# Patient Record
Sex: Female | Born: 1990 | Race: White | Hispanic: No | Marital: Single | Smoking: Former smoker
Health system: Southern US, Community
[De-identification: ages and names within clinical notes are randomized; demographics above are authoritative.]

## PROBLEM LIST (undated history)

## (undated) DIAGNOSIS — R519 Headache, unspecified: Secondary | ICD-10-CM

## (undated) DIAGNOSIS — F419 Anxiety disorder, unspecified: Secondary | ICD-10-CM

## (undated) HISTORY — DX: Anxiety disorder, unspecified: F41.9

---

## 2021-07-13 ENCOUNTER — Ambulatory Visit (INDEPENDENT_AMBULATORY_CARE_PROVIDER_SITE_OTHER): Payer: 59 | Admitting: Obstetrics and Gynecology

## 2021-07-13 ENCOUNTER — Other Ambulatory Visit: Payer: Self-pay

## 2021-07-13 ENCOUNTER — Ambulatory Visit (INDEPENDENT_AMBULATORY_CARE_PROVIDER_SITE_OTHER): Payer: 59

## 2021-07-13 ENCOUNTER — Ambulatory Visit: Payer: Self-pay

## 2021-07-13 VITALS — Ht 64.0 in | Wt 137.9 lb

## 2021-07-13 DIAGNOSIS — O3680X0 Pregnancy with inconclusive fetal viability, not applicable or unspecified: Secondary | ICD-10-CM

## 2021-07-13 DIAGNOSIS — Z3481 Encounter for supervision of other normal pregnancy, first trimester: Secondary | ICD-10-CM

## 2021-07-13 DIAGNOSIS — O021 Missed abortion: Secondary | ICD-10-CM | POA: Diagnosis not present

## 2021-07-13 DIAGNOSIS — Z3491 Encounter for supervision of normal pregnancy, unspecified, first trimester: Secondary | ICD-10-CM | POA: Insufficient documentation

## 2021-07-13 LAB — POCT URINE PREGNANCY: Preg Test, Ur: POSITIVE — AB

## 2021-07-13 NOTE — Progress Notes (Signed)
New OB Intake  I connected with  Latamara Quinto on 07/13/21 at  3:00 PM EDT by in person. Video Visit and verified that I am speaking with the correct person using two identifiers. Nurse is located at [facility] and pt is located at [place].  I discussed the limitations, risks, security and privacy concerns of performing an evaluation and management service by telephone and the availability of in person appointments. I also discussed with the patient that there may be a patient responsible charge related to this service. The patient expressed understanding and agreed to proceed.  I explained I am completing New OB Intake today. Dating and viability scan revealed a non viable pregnancy at [redacted]w[redacted]d. Pt is G2/P1001. I reviewed her allergies, medications, Medical/Surgical/OB history, and appropriate screenings. I informed her of [facility] services. Based on history, this is a/an  pregnancy uncomplicated .   Patient Active Problem List   Diagnosis Date Noted   Encounter for supervision of normal pregnancy in first trimester 07/13/2021    Concerns addressed today  Delivery Plans:  Plans to deliver at [facility].   MyChart/Babyscripts MyChart access verified. I explained pt will have some visits in office and some virtually. Babyscripts instructions given and order placed. Patient verifies receipt of registration text/e-mail. Account successfully created and app downloaded.  Blood Pressure Cuff  Patient has private insurance; instructed to purchase blood pressure cuff and bring to first prenatal appt. Explained after first prenatal appt pt will check weekly and document in Babyscripts.  Weight scale: Patient    have weight scale. Weight scale ordered for patient to pick up form [facility].   Anatomy US Explained first scheduled [place] will be around 19 weeks. Dating and viability scan performed today.  Labs Discussed Avelina Laine genetic screening with patient. Would like both Panorama and Horizon drawn at new  OB visit. Routine prenatal labs needed.  Covid Vaccine Patient has not covid vaccine.   Mother/ Baby Dyad Candidate?    If yes, offer as possibility  Informed patient of [facility] website  and placed link in her AVS.   Social Determinants of Health Food Insecurity: Patient denies food insecurity. WIC Referral: Patient is interested in referral to [facility].  Transportation: Patient denies transportation needs. Childcare: Discussed no children allowed at ultrasound appointments. Offered childcare services; patient declines childcare services at this time.  Send link to Pregnancy Navigators   Placed OB Box on problem list and updated  First visit review I reviewed new OB appt with pt. Patient was seen by Dr. Jolayne Panther today for a non viable pregnancy at [redacted]w[redacted]d  Hibah Odonnell J Ravinder Lukehart, RN 07/13/2021  3:39 PM

## 2021-07-13 NOTE — Progress Notes (Signed)
29 yo P1 at [redacted]w[redacted]d by certain LMP presenting for pregnancy verification and new ob intake. Pelvic ultrasound performed both abdominally and transvaginally confirming an intrauterine pregnancy measuring 9 weeks without cardiac activity. This was consistent with a missed abortion Emotional support was provided.  Reviewed options of continue expectant management, medical management with cytotec or surgical intervention with D&E. Patient opted D&E  Patient will be scheduled for D&E.

## 2021-07-14 ENCOUNTER — Other Ambulatory Visit: Payer: Self-pay | Admitting: Obstetrics and Gynecology

## 2021-07-14 ENCOUNTER — Telehealth: Payer: Self-pay | Admitting: *Deleted

## 2021-07-14 ENCOUNTER — Other Ambulatory Visit: Payer: Self-pay

## 2021-07-14 ENCOUNTER — Encounter (HOSPITAL_BASED_OUTPATIENT_CLINIC_OR_DEPARTMENT_OTHER): Payer: Self-pay | Admitting: Obstetrics & Gynecology

## 2021-07-14 NOTE — Telephone Encounter (Signed)
Call to patient- advised surgery is scheduled for tomorrow 07-15-21 at 0900 at [facility]- arrive 0700. Surgery instructions reviewed and advised pre-op nurse will call.  Encounter closed.

## 2021-07-14 NOTE — H&P (Signed)
Preoperative History and Physical  Maria Meyer is a 30 y.o. G2P1001 here for surgical management of first trimester missed abortion measuring around nine weeks gestation; diagnosed during office visit on 07/13/21.   No significant preoperative concerns.  Proposed surgery: Dilation and Evacuation  Past Medical History:  Diagnosis Date   Anxiety    Headache    tension   Past Surgical History:  Procedure Laterality Date   CESAREAN SECTION  01/04/2013   OB History  Gravida Para Term Preterm AB Living  2 1 1     1   SAB IAB Ectopic Multiple Live Births          1    # Outcome Date GA Lbr Len/2nd Weight Sex Delivery Anes PTL Lv  2 Gravida           1 Term 01/04/13 [redacted]w[redacted]d  3799 g M CS-LTranv   LIV  Patient denies any other pertinent gynecologic issues.   No current facility-administered medications on file prior to encounter.   Current Outpatient Medications on File Prior to Encounter  Medication Sig Dispense Refill   Prenatal MV & Min w/FA-DHA (PRENATAL GUMMIES PO) Take by mouth.     Allergies  Allergen Reactions   Penicillins Hives    "Hives"    Social History:   reports that she quit smoking about 14 months ago. Her smoking use included cigarettes. She has never used smokeless tobacco. She reports that she does not currently use alcohol. She reports that she does not currently use drugs after having used the following drugs: Marijuana.  Family History  Problem Relation Age of Onset   Cancer Paternal Grandmother     Review of Systems: Pertinent items noted in HPI and remainder of comprehensive ROS otherwise negative.  PHYSICAL EXAM: Blood pressure 120/62, pulse 62, temperature 97.9 F (36.6 C), temperature source Oral, resp. rate 16, height 5\' 4"  (1.626 m), weight 64.7 kg, last menstrual period 04/14/2021, SpO2 100 %, unknown if currently breastfeeding. CONSTITUTIONAL: Well-developed, well-nourished female in no acute distress.  HENT:  Normocephalic, atraumatic,  External right and left ear normal. Oropharynx is clear and moist EYES: Conjunctivae and EOM are normal. Pupils are equal, round, and reactive to light. No scleral icterus.  NECK: Normal range of motion, supple, no masses SKIN: Skin is warm and dry. No rash noted. Not diaphoretic. No erythema. No pallor. NEUROLOGIC: Alert and oriented to person, place, and time. Normal reflexes, muscle tone coordination. No cranial nerve deficit noted. PSYCHIATRIC: Normal mood and affect. Normal behavior. Normal judgment and thought content. CARDIOVASCULAR: Normal heart rate noted, regular rhythm RESPIRATORY: Effort and breath sounds normal, no problems with respiration noted ABDOMEN: Soft, nontender, nondistended. PELVIC: Deferred MUSCULOSKELETAL: Normal range of motion. No edema and no tenderness. 2+ distal pulses.  Labs: Results for orders placed or performed during the hospital encounter of 07/15/21 (from the past 336 hour(s))  CBC   Collection Time: 07/15/21  8:00 AM  Result Value Ref Range   WBC 5.8 4.0 - 10.5 K/uL   RBC 4.33 3.87 - 5.11 MIL/uL   Hemoglobin 13.3 12.0 - 15.0 g/dL   HCT 07/17/21 07/17/21 - 61.4 %   MCV 91.9 80.0 - 100.0 fL   MCH 30.7 26.0 - 34.0 pg   MCHC 33.4 30.0 - 36.0 g/dL   RDW 43.1 54.0 - 08.6 %   Platelets 197 150 - 400 K/uL   nRBC 0.0 0.0 - 0.2 %  Type and screen [facility]  Collection Time: 07/15/21  8:00 AM  Result Value Ref Range   ABO/RH(D) PENDING    Antibody Screen PENDING    Sample Expiration      07/18/2021,2359 Performed at [facility], [address], [place], [place] [address]   Results for orders placed or performed in visit on 07/13/21 (from the past 336 hour(s))  POCT urine pregnancy   Collection Time: 07/13/21  4:56 PM  Result Value Ref Range   Preg Test, Ur Positive (A) Negative   Imaging: US OB Limited  Result Date: 07/15/2021 ----------------------------------------------------------------------  OBSTETRICS REPORT                        (Signed Final 07/15/2021 08:01 am) ---------------------------------------------------------------------- Patient Info  ID #:       [id number]                          D.O.B.:  [birth year] (29 yrs)  Name:       Maria Meyer                  Visit Date: 07/13/2021 03:45 am ---------------------------------------------------------------------- Performed By  Attending:        Catalina Antigua MD      Ref. Address:     Faculty  Performed By:     Angelica Pou RN         Location:         Center for                                                             [facility]  Referred By:      Catalina Antigua MD ---------------------------------------------------------------------- Orders  #  Description                           Code        Ordered By  1  US OB LIMITED                         76195.0     PEGGY CONSTANT ----------------------------------------------------------------------  #  Order #                     Accession #                Episode #  1  [id number]                   [id number]                 [id number] ---------------------------------------------------------------------- Indications  [redacted] weeks gestation of pregnancy                 Z3A.08 ---------------------------------------------------------------------- Fetal Evaluation  Num Of Fetuses:         1  Preg. Location:         Intrauterine  Cardiac Activity:       Not visualized ---------------------------------------------------------------------- Biometry  CRL:      23.2  mm     G. Age:  8w 6d                   EDD:   02/16/22 ---------------------------------------------------------------------- Gestational Age  Best:          8w 6d      Det. By:  U/S C R L (07/13/21)     EDD:   02/16/22 ---------------------------------------------------------------------- Comments  Non living IUP  at [redacted]w[redacted]d by CRL. ---------------------------------------------------------------------- Impression  Missed abortion around nine weeks gestation ---------------------------------------------------------------------- Recommendations  Patient was counseled by Dr. Jolayne Panther about options for  management, she will be scheduled for surgical management. ----------------------------------------------------------------------               Jaynie Collins, MD Electronically Signed Final Report   07/15/2021 08:01 am ----------------------------------------------------------------------   Assessment: Patient Active Problem List   Diagnosis Date Noted   Missed abortion around nine weeks gestation 07/13/2021    Plan: Patient will undergo surgical management with Dilation and Evacuation.  Risks of surgery including bleeding, infection, injury to surrounding organs, need for additional procedures, possibility of intrauterine scarring which may impair future fertility, risk of retained products which may require further management and other postoperative/anesthesia complications were explained to patient.  Patient was offered Kizzie Fantasia genetic testing for the products of conception, she declined this option.  Likelihood of success of complete evacuation of the uterus was discussed with the patient.  Written informed consent was obtained.  Patient has been NPO since last night  she will remain NPO for procedure. Anesthesia and OR aware.  Preoperative prophylactic Doxycycline 200mg  IV  has been ordered and is on call to the OR.  To OR when ready.     , MD, FACOG Obstetrician & Gynecologist, [facility] for [facility]

## 2021-07-14 NOTE — Progress Notes (Addendum)
Spoke w/ via phone for pre-op interview---pt Lab needs dos----  none per anesthesia, surgery orders pending             Lab results------none COVID test -----patient states asymptomatic no test needed Arrive at -------715 am 07-15-2021 NPO after MN NO Solid Food.  Clear liquids from MN until---615 am Med rec completed Medications to take morning of surgery -----none Diabetic medication -----n/a Patient instructed no nail polish to be worn day of surgery Patient instructed to bring photo id and insurance card day of surgery Patient aware to have Driver (ride ) / caregiver partner jimmy hill    for 24 hours after surgery  Patient Special Instructions -----none Pre-Op special Istructions -----none Patient verbalized understanding of instructions that were given at this phone interview. Patient denies shortness of breath, chest pain, fever, cough at this phone interview.

## 2021-07-15 ENCOUNTER — Ambulatory Visit (HOSPITAL_BASED_OUTPATIENT_CLINIC_OR_DEPARTMENT_OTHER): Payer: 59 | Admitting: Anesthesiology

## 2021-07-15 ENCOUNTER — Encounter (HOSPITAL_BASED_OUTPATIENT_CLINIC_OR_DEPARTMENT_OTHER): Payer: Self-pay | Admitting: Obstetrics & Gynecology

## 2021-07-15 ENCOUNTER — Encounter (HOSPITAL_BASED_OUTPATIENT_CLINIC_OR_DEPARTMENT_OTHER)
Admission: RE | Disposition: A | Discharge status: Home / Self Care | Payer: Self-pay | Source: Home / Self Care | Attending: Obstetrics & Gynecology

## 2021-07-15 ENCOUNTER — Ambulatory Visit (HOSPITAL_BASED_OUTPATIENT_CLINIC_OR_DEPARTMENT_OTHER)
Admission: RE | Admit: 2021-07-15 | Discharge: 2021-07-15 | Disposition: A | Discharge status: Home / Self Care | Payer: 59 | Attending: Obstetrics & Gynecology | Admitting: Obstetrics & Gynecology

## 2021-07-15 DIAGNOSIS — Z87891 Personal history of nicotine dependence: Secondary | ICD-10-CM | POA: Diagnosis not present

## 2021-07-15 DIAGNOSIS — Z880 Allergy status to penicillin: Secondary | ICD-10-CM | POA: Insufficient documentation

## 2021-07-15 DIAGNOSIS — O021 Missed abortion: Secondary | ICD-10-CM | POA: Diagnosis not present

## 2021-07-15 HISTORY — PX: DILATION AND EVACUATION: SHX1459

## 2021-07-15 HISTORY — DX: Headache, unspecified: R51.9

## 2021-07-15 LAB — TYPE AND SCREEN
ABO/RH(D): O POS
Antibody Screen: NEGATIVE

## 2021-07-15 LAB — CBC
HCT: 39.8 % (ref 36.0–46.0)
Hemoglobin: 13.3 g/dL (ref 12.0–15.0)
MCH: 30.7 pg (ref 26.0–34.0)
MCHC: 33.4 g/dL (ref 30.0–36.0)
MCV: 91.9 fL (ref 80.0–100.0)
Platelets: 197 K/uL (ref 150–400)
RBC: 4.33 MIL/uL (ref 3.87–5.11)
RDW: 11.9 % (ref 11.5–15.5)
WBC: 5.8 K/uL (ref 4.0–10.5)
nRBC: 0 % (ref 0.0–0.2)

## 2021-07-15 LAB — ABO/RH: ABO/RH(D): O POS

## 2021-07-15 SURGERY — DILATION AND EVACUATION, UTERUS
Anesthesia: Monitor Anesthesia Care | Site: Vagina

## 2021-07-15 MED ORDER — PROPOFOL 10 MG/ML IV BOLUS
INTRAVENOUS | Status: AC
  Filled 2021-07-15: qty 20

## 2021-07-15 MED ORDER — PROPOFOL 500 MG/50ML IV EMUL
INTRAVENOUS | Status: DC | PRN
  Administered 2021-07-15: 125 ug/kg/min via INTRAVENOUS

## 2021-07-15 MED ORDER — BUPIVACAINE HCL 0.5 % IJ SOLN
INTRAMUSCULAR | Status: DC | PRN
  Administered 2021-07-15: 30 mL

## 2021-07-15 MED ORDER — DOXYCYCLINE HYCLATE 100 MG IV SOLR
200.0000 mg | INTRAVENOUS | Status: AC
  Administered 2021-07-15: 200 mg via INTRAVENOUS
  Filled 2021-07-15: qty 200

## 2021-07-15 MED ORDER — MIDAZOLAM HCL 2 MG/2ML IJ SOLN
INTRAMUSCULAR | Status: AC
  Filled 2021-07-15: qty 2

## 2021-07-15 MED ORDER — FENTANYL CITRATE (PF) 100 MCG/2ML IJ SOLN
INTRAMUSCULAR | Status: DC | PRN
  Administered 2021-07-15 (×2): 50 ug via INTRAVENOUS

## 2021-07-15 MED ORDER — ONDANSETRON HCL 4 MG/2ML IJ SOLN
INTRAMUSCULAR | Status: DC | PRN
  Administered 2021-07-15: 4 mg via INTRAVENOUS

## 2021-07-15 MED ORDER — ACETAMINOPHEN 500 MG PO TABS
1000.0000 mg | ORAL_TABLET | Freq: Once | ORAL | Status: AC
  Administered 2021-07-15: 1000 mg via ORAL

## 2021-07-15 MED ORDER — OXYCODONE HCL 5 MG PO TABS: 5.0000 mg | ORAL_TABLET | Freq: Once | ORAL | 0 refills | Status: DC | PRN

## 2021-07-15 MED ORDER — KETOROLAC TROMETHAMINE 30 MG/ML IJ SOLN
INTRAMUSCULAR | Status: DC | PRN
  Administered 2021-07-15: 30 mg via INTRAVENOUS

## 2021-07-15 MED ORDER — OXYCODONE HCL 5 MG/5ML PO SOLN
5.0000 mg | Freq: Once | ORAL | Status: AC | PRN
Start: 2021-07-15 — End: 2021-07-15

## 2021-07-15 MED ORDER — FENTANYL CITRATE (PF) 100 MCG/2ML IJ SOLN
INTRAMUSCULAR | Status: AC
  Filled 2021-07-15: qty 2

## 2021-07-15 MED ORDER — OXYCODONE HCL 5 MG PO TABS
5.0000 mg | ORAL_TABLET | Freq: Once | ORAL | Status: AC | PRN
  Administered 2021-07-15: 5 mg via ORAL

## 2021-07-15 MED ORDER — PROMETHAZINE HCL 25 MG/ML IJ SOLN: 6.2500 mg | INTRAMUSCULAR | Status: DC | PRN

## 2021-07-15 MED ORDER — LIDOCAINE HCL (CARDIAC) PF 100 MG/5ML IV SOSY
PREFILLED_SYRINGE | INTRAVENOUS | Status: DC | PRN
Start: 2021-07-15 — End: 2021-07-15
  Administered 2021-07-15: 60 mg via INTRAVENOUS

## 2021-07-15 MED ORDER — ACETAMINOPHEN 500 MG PO TABS
ORAL_TABLET | ORAL | Status: AC
  Filled 2021-07-15: qty 2

## 2021-07-15 MED ORDER — ONDANSETRON HCL 4 MG/2ML IJ SOLN
INTRAMUSCULAR | Status: AC
  Filled 2021-07-15: qty 2

## 2021-07-15 MED ORDER — IBUPROFEN 600 MG PO TABS
600.0000 mg | ORAL_TABLET | Freq: Four times a day (QID) | ORAL | 2 refills | Status: DC | PRN
Start: 2021-07-15 — End: 2021-10-13

## 2021-07-15 MED ORDER — LACTATED RINGERS IV SOLN
INTRAVENOUS | Status: DC
  Administered 2021-07-15: 1000 mL via INTRAVENOUS

## 2021-07-15 MED ORDER — POVIDONE-IODINE 10 % EX SWAB: 2.0000 "application " | Freq: Once | CUTANEOUS | Status: DC

## 2021-07-15 MED ORDER — ALPRAZOLAM 0.5 MG PO TABS: 0.5000 mg | ORAL_TABLET | Freq: Three times a day (TID) | ORAL | 0 refills | Status: DC | PRN

## 2021-07-15 MED ORDER — DROPERIDOL 2.5 MG/ML IJ SOLN: 0.6250 mg | Freq: Once | INTRAMUSCULAR | Status: DC | PRN

## 2021-07-15 MED ORDER — OXYCODONE HCL 5 MG PO TABS
ORAL_TABLET | ORAL | Status: AC
  Filled 2021-07-15: qty 1

## 2021-07-15 MED ORDER — MIDAZOLAM HCL 2 MG/2ML IJ SOLN
INTRAMUSCULAR | Status: DC | PRN
  Administered 2021-07-15: 2 mg via INTRAVENOUS
  Administered 2021-07-15: .5 mg via INTRAVENOUS
  Administered 2021-07-15: 1 mg via INTRAVENOUS
  Administered 2021-07-15: .5 mg via INTRAVENOUS

## 2021-07-15 MED ORDER — DOCUSATE SODIUM 100 MG PO CAPS: 100.0000 mg | ORAL_CAPSULE | Freq: Two times a day (BID) | ORAL | 2 refills | Status: DC | PRN

## 2021-07-15 MED ORDER — DEXAMETHASONE SODIUM PHOSPHATE 10 MG/ML IJ SOLN
INTRAMUSCULAR | Status: DC | PRN
  Administered 2021-07-15: 10 mg via INTRAVENOUS

## 2021-07-15 MED ORDER — LACTATED RINGERS IV SOLN: INTRAVENOUS | Status: DC

## 2021-07-15 MED ORDER — FENTANYL CITRATE (PF) 100 MCG/2ML IJ SOLN
25.0000 ug | INTRAMUSCULAR | Status: DC | PRN
  Administered 2021-07-15 (×2): 50 ug via INTRAVENOUS

## 2021-07-15 MED ORDER — 0.9 % SODIUM CHLORIDE (POUR BTL) OPTIME
TOPICAL | Status: DC | PRN
  Administered 2021-07-15: 500 mL

## 2021-07-15 MED ORDER — PROPOFOL 10 MG/ML IV BOLUS
INTRAVENOUS | Status: DC | PRN
  Administered 2021-07-15: 20 mg via INTRAVENOUS
  Administered 2021-07-15: 40 mg via INTRAVENOUS

## 2021-07-15 MED ORDER — DEXAMETHASONE SODIUM PHOSPHATE 10 MG/ML IJ SOLN
INTRAMUSCULAR | Status: AC
  Filled 2021-07-15: qty 1

## 2021-07-15 MED ORDER — KETOROLAC TROMETHAMINE 30 MG/ML IJ SOLN
INTRAMUSCULAR | Status: AC
  Filled 2021-07-15: qty 1

## 2021-07-15 SURGICAL SUPPLY — 26 items
CATH ROBINSON RED A/P 14FR (CATHETERS) ×2 IMPLANT
DECANTER SPIKE VIAL GLASS SM (MISCELLANEOUS) ×2 IMPLANT
FILTER UTR ASPR ASSEMBLY (MISCELLANEOUS) IMPLANT
GAUZE 4X4 16PLY ~~LOC~~+RFID DBL (SPONGE) ×2 IMPLANT
GLOVE SURG ENC MOIS LTX SZ6 (GLOVE) ×2 IMPLANT
GLOVE SURG LTX SZ7 (GLOVE) ×2 IMPLANT
GLOVE SURG UNDER POLY LF SZ6 (GLOVE) ×2 IMPLANT
GLOVE SURG UNDER POLY LF SZ7 (GLOVE) ×2 IMPLANT
GLOVE SURG UNDER POLY LF SZ7.5 (GLOVE) ×2 IMPLANT
GOWN STRL REUS W/TWL LRG LVL3 (GOWN DISPOSABLE) ×4 IMPLANT
HOSE CONNECTING 18IN BERKELEY (TUBING) ×2 IMPLANT
KIT BERKELEY 1ST TRI 3/8 NO TR (MISCELLANEOUS) IMPLANT
KIT BERKELEY 1ST TRIMESTER 3/8 (MISCELLANEOUS) ×4 IMPLANT
KIT TURNOVER CYSTO (KITS) ×2 IMPLANT
NS IRRIG 1000ML POUR BTL (IV SOLUTION) ×2 IMPLANT
PACK VAGINAL MINOR WOMEN LF (CUSTOM PROCEDURE TRAY) ×2 IMPLANT
PAD OB MATERNITY 4.3X12.25 (PERSONAL CARE ITEMS) ×2 IMPLANT
PAD PREP 24X48 CUFFED NSTRL (MISCELLANEOUS) ×2 IMPLANT
SET BERKELEY SUCTION TUBING (SUCTIONS) ×2 IMPLANT
SLEEVE SCD COMPRESS KNEE MED (STOCKING) ×2 IMPLANT
TOWEL OR 17X26 10 PK STRL BLUE (TOWEL DISPOSABLE) ×2 IMPLANT
VACURETTE 10 RIGID CVD (CANNULA) IMPLANT
VACURETTE 6 ASPIR F TIP BERK (CANNULA) IMPLANT
VACURETTE 7MM CVD STRL WRAP (CANNULA) IMPLANT
VACURETTE 8 RIGID CVD (CANNULA) IMPLANT
VACURETTE 9 RIGID CVD (CANNULA) ×2 IMPLANT

## 2021-07-15 NOTE — Anesthesia Postprocedure Evaluation (Signed)
Anesthesia Post Note  Patient: Maria Meyer  Procedure(s) Performed: DILATATION AND EVACUATION (Vagina )     Patient location during evaluation: PACU Anesthesia Type: MAC Level of consciousness: awake Pain management: pain level controlled Vital Signs Assessment: post-procedure vital signs reviewed and stable Respiratory status: spontaneous breathing and respiratory function stable Cardiovascular status: stable Postop Assessment: no apparent nausea or vomiting Anesthetic complications: no   No notable events documented.  Last Vitals:  Vitals:   07/15/21 0748 07/15/21 0930  BP: 120/62 127/88  Pulse: 62 64  Resp: 16 18  Temp: 36.6 C 36.6 C  SpO2: 100% 100%    Last Pain:  Vitals:   07/15/21 0930  TempSrc:   PainSc: 0-No pain                 Merlinda Frederick

## 2021-07-15 NOTE — Op Note (Signed)
Maria Meyer PROCEDURE DATE: 07/15/2021  PREOPERATIVE DIAGNOSIS: Nine week missed abortion POSTOPERATIVE DIAGNOSIS: The same PROCEDURE:  Dilation and Evacuation SURGEON:  Dr. Jaynie Collins  INDICATIONS: 30 y.o. G2P1001 with MAB at nine weeks gestation, needing surgical completion.  Risks of surgery were discussed with the patient including but not limited to: bleeding which may require transfusion; infection which may require antibiotics; injury to uterus or surrounding organs; need for additional procedures including laparotomy or laparoscopy; possibility of intrauterine scarring which may impair future fertility; and other postoperative/anesthesia complications. Written informed consent was obtained.    FINDINGS:  A nine week size uterus, moderate amounts of products of conception, specimen sent to pathology.  ANESTHESIA: Monitored anesthesia care, paracervical block with 30 ml of 0.5% Marcaine. ESTIMATED BLOOD LOSS:  50 ml. SPECIMENS:  Products of conception sent to pathology COMPLICATIONS:  None immediate.  PROCEDURE DETAILS:  The patient received intravenous Doxycycline while in the preoperative area.  She was then taken to the operating room where general anesthesia was administered and was found to be adequate.  After an adequate timeout was performed, she was placed in the dorsal lithotomy position and examined; then prepped and draped in the sterile manner.   Her bladder was catheterized for an unmeasured amount of clear, yellow urine. A vaginal speculum was then placed in the patient's vagina and a single tooth tenaculum was applied to the anterior lip of the cervix.  A paracervical block using 30 ml of 0.5% Marcaine was administered. The cervix was gently dilated to accommodate a 9 mm suction curette that was gently advanced to the uterine fundus. The suction device was then activated and curette slowly rotated to clear the uterus of products of conception.  Suction curettage was done  until complete emptying of the uterus was confirmed. There was minimal bleeding noted and the tenaculum removed with good hemostasis noted.   All instruments were removed from the patient's vagina.  Sponge and instrument counts were correct times two  The patient tolerated the procedure well and was taken to the recovery area extubated, awake, and in stable condition.  The patient will be discharged to home as per PACU criteria.  Routine postoperative instructions given.  She was prescribed Oxycodone, Ibuprofen and Colace.  Patient was also very anxious before and after procedure, has history of anxiety and requested an anxiolytic agent. Reported Alprazolam had worked for her tin the past, she was prescribed fifteen tablets of 0.5 mg dosage for postoperative anxiety. Further medication will need to be given by her mental health provider.  She will follow up in the office in 2-3 weeks for postoperative evaluation.   Jaynie Collins, MD, FACOG Obstetrician & Gynecologist, [facility] for [employer], [facility]

## 2021-07-15 NOTE — Anesthesia Procedure Notes (Signed)
Procedure Name: MAC Date/Time: 07/15/2021 8:58 AM Performed by: Justice Rocher, CRNA Pre-anesthesia Checklist: Timeout performed, Patient being monitored, Suction available, Emergency Drugs available and Patient identified Patient Re-evaluated:Patient Re-evaluated prior to induction Oxygen Delivery Method: Simple face mask Preoxygenation: Pre-oxygenation with 100% oxygen Induction Type: IV induction Placement Confirmation: positive ETCO2, breath sounds checked- equal and bilateral and CO2 detector

## 2021-07-15 NOTE — Anesthesia Preprocedure Evaluation (Addendum)
Anesthesia Evaluation  Patient identified by MRN, date of birth, ID band Patient awake    Reviewed: Allergy & Precautions, NPO status , Patient's Chart, lab work & pertinent test results  Airway Mallampati: I  TM Distance: >3 FB Neck ROM: Full    Dental no notable dental hx.    Pulmonary neg pulmonary ROS, former smoker,    Pulmonary exam normal breath sounds clear to auscultation       Cardiovascular Exercise Tolerance: Good negative cardio ROS Normal cardiovascular exam Rhythm:Regular Rate:Normal     Neuro/Psych  Headaches, Anxiety    GI/Hepatic negative GI ROS, Neg liver ROS,   Endo/Other    Renal/GU negative Renal ROS  negative genitourinary   Musculoskeletal negative musculoskeletal ROS (+)   Abdominal   Peds negative pediatric ROS (+)  Hematology negative hematology ROS (+)   Anesthesia Other Findings   Reproductive/Obstetrics negative OB ROS                           Anesthesia Physical Anesthesia Plan  ASA: 1  Anesthesia Plan: MAC   Post-op Pain Management:    Induction: Intravenous  PONV Risk Score and Plan: 2 and Propofol infusion, Midazolam, Treatment may vary due to age or medical condition and TIVA  Airway Management Planned: Natural Airway and Simple Face Mask  Additional Equipment: None  Intra-op Plan:   Post-operative Plan:   Informed Consent: I have reviewed the patients History and Physical, chart, labs and discussed the procedure including the risks, benefits and alternatives for the proposed anesthesia with the patient or authorized representative who has indicated his/her understanding and acceptance.     Dental advisory given  Plan Discussed with: CRNA, Anesthesiologist and Surgeon  Anesthesia Plan Comments: (MAC with Propofol gtt. GA/LMA as backup plan. Norton Blizzard, MD  )       Anesthesia Quick Evaluation

## 2021-07-15 NOTE — Transfer of Care (Signed)
Immediate Anesthesia Transfer of Care Note  Patient: Maria Meyer  Procedure(s) Performed: Procedure(s) (LRB): DILATATION AND EVACUATION (N/A)  Patient Location: PACU  Anesthesia Type: MAC  Level of Consciousness: awake, sedated, patient cooperative and responds to stimulation  Airway & Oxygen Therapy: Patient Spontanous Breathing and Patient connected to face mask oxygen  Post-op Assessment: Report given to PACU RN, Post -op Vital signs reviewed and stable and Patient moving all extremities  Post vital signs: Reviewed and stable  Complications: No apparent anesthesia complications

## 2021-07-15 NOTE — Discharge Instructions (Signed)

## 2021-07-16 ENCOUNTER — Encounter (HOSPITAL_BASED_OUTPATIENT_CLINIC_OR_DEPARTMENT_OTHER): Payer: Self-pay | Admitting: Obstetrics & Gynecology

## 2021-07-16 LAB — SURGICAL PATHOLOGY

## 2021-08-03 ENCOUNTER — Institutional Professional Consult (permissible substitution): Payer: 59 | Admitting: Licensed Clinical Social Worker

## 2021-08-03 ENCOUNTER — Encounter: Payer: 59 | Admitting: Obstetrics and Gynecology

## 2021-10-13 ENCOUNTER — Other Ambulatory Visit: Payer: Self-pay

## 2021-10-13 ENCOUNTER — Ambulatory Visit (INDEPENDENT_AMBULATORY_CARE_PROVIDER_SITE_OTHER): Payer: 59 | Admitting: *Deleted

## 2021-10-13 DIAGNOSIS — O219 Vomiting of pregnancy, unspecified: Secondary | ICD-10-CM

## 2021-10-13 DIAGNOSIS — N926 Irregular menstruation, unspecified: Secondary | ICD-10-CM | POA: Diagnosis not present

## 2021-10-13 DIAGNOSIS — Z3201 Encounter for pregnancy test, result positive: Secondary | ICD-10-CM

## 2021-10-13 LAB — POCT URINE PREGNANCY: Preg Test, Ur: POSITIVE — AB

## 2021-10-13 MED ORDER — DOXYLAMINE-PYRIDOXINE 10-10 MG PO TBEC: DELAYED_RELEASE_TABLET | ORAL | 5 refills | Status: DC

## 2021-10-13 NOTE — Progress Notes (Signed)
Ms. Denzler presents today for UPT. She complains of nausea with vomiting. LMP:08/14/21    OBJECTIVE: Appears well, in no apparent distress.  OB History     Gravida  2   Para  1   Term  1   Preterm      AB      Living  1      SAB      IAB      Ectopic      Multiple      Live Births  1          Home UPT Result:Positive In-Office UPT result:Positive I have reviewed the patient's allergies and medications.   ASSESSMENT: Positive pregnancy test  PLAN Prenatal care to be completed at: [facility] ordered today for N&V - advised if not covered by ins may take Vit B6 and Unisom.

## 2021-10-13 NOTE — Progress Notes (Signed)
Patient seen and assessed by nursing staff.  Agree with documentation and plan.  

## 2021-10-18 NOTE — L&D Delivery Note (Cosign Needed Addendum)
Opened in Error.

## 2021-10-30 ENCOUNTER — Ambulatory Visit (INDEPENDENT_AMBULATORY_CARE_PROVIDER_SITE_OTHER): Payer: Self-pay

## 2021-10-30 ENCOUNTER — Ambulatory Visit (INDEPENDENT_AMBULATORY_CARE_PROVIDER_SITE_OTHER): Payer: 59

## 2021-10-30 ENCOUNTER — Other Ambulatory Visit: Payer: Self-pay

## 2021-10-30 VITALS — BP 108/68 | HR 82 | Ht 64.0 in | Wt 139.8 lb

## 2021-10-30 DIAGNOSIS — Z3A10 10 weeks gestation of pregnancy: Secondary | ICD-10-CM

## 2021-10-30 DIAGNOSIS — Z3481 Encounter for supervision of other normal pregnancy, first trimester: Secondary | ICD-10-CM

## 2021-10-30 DIAGNOSIS — Z3490 Encounter for supervision of normal pregnancy, unspecified, unspecified trimester: Secondary | ICD-10-CM | POA: Insufficient documentation

## 2021-10-30 NOTE — Progress Notes (Signed)
New OB Intake  I connected with  Maria Meyer on 10/30/21 at  8:15 AM EST by in person and verified that I am speaking with the correct person using two identifiers. Nurse is located at [facility] and pt is located at [place].  I discussed the limitations, risks, security and privacy concerns of performing an evaluation and management service by telephone and the availability of in person appointments. I also discussed with the patient that there may be a patient responsible charge related to this service. The patient expressed understanding and agreed to proceed.  I explained I am completing New OB Intake today. We discussed her EDD of 05/22/22 that is based on LMP of 08/15/21. Pt is G3/P1011. I reviewed her allergies, medications, Medical/Surgical/OB history, and appropriate screenings. I informed her of [facility] services. Based on history, this is a/an  pregnancy uncomplicated .   Patient Active Problem List   Diagnosis Date Noted   Supervision of normal pregnancy 10/30/2021   Missed abortion around nine weeks gestation 07/13/2021    Concerns addressed today  Delivery Plans:  Plans to deliver at [facility].   MyChart/Babyscripts MyChart access verified. I explained pt will have some visits in office and some virtually. Babyscripts instructions given and order placed. Patient verifies receipt of registration text/e-mail. Account successfully created and app downloaded.  Blood Pressure Cuff  Patient has private insurance; instructed to purchase blood pressure cuff and bring to first prenatal appt. Explained after first prenatal appt pt will check weekly and document in Babyscripts.  Weight scale: Patient does have weight scale.   Anatomy US Explained first scheduled [place] will be around 19 weeks. Dating and viability scan performed today.  Labs Discussed Avelina Laine genetic screening with patient. Would like both Panorama and Horizon drawn at new OB visit. Routine prenatal labs needed.  Covid  Vaccine Patient has not covid vaccine.    Informed patient of [facility] website  and placed link in her AVS.   Social Determinants of Health Food Insecurity: Patient denies food insecurity. WIC Referral: Patient is not interested in referral to [facility].  Transportation: Patient denies transportation needs. Childcare: Discussed no children allowed at ultrasound appointments. Offered childcare services; patient declines childcare services at this time.  Send link to Pregnancy Navigators   Placed OB Box on problem list and updated  First visit review I reviewed new OB appt with pt. I explained she will have a pelvic exam, ob bloodwork with genetic screening, and PAP smear. Explained pt will be seen by Peggy Constant at first visit; encounter routed to appropriate provider. Explained that patient will be seen by pregnancy navigator following visit with provider. [facility] information placed in AVS.   Hamilton Capri, RN 10/30/2021  8:57 AM

## 2021-10-30 NOTE — Progress Notes (Signed)
Agree with nurses's documentation of this patient's clinic encounter.  Zayden Hahne L, MD  

## 2021-11-23 ENCOUNTER — Other Ambulatory Visit (HOSPITAL_COMMUNITY)
Admission: RE | Admit: 2021-11-23 | Discharge: 2021-11-23 | Disposition: A | Discharge status: Home / Self Care | Payer: BC Managed Care – PPO | Source: Ambulatory Visit | Attending: Obstetrics and Gynecology | Admitting: Obstetrics and Gynecology

## 2021-11-23 ENCOUNTER — Other Ambulatory Visit: Payer: Self-pay

## 2021-11-23 ENCOUNTER — Ambulatory Visit (INDEPENDENT_AMBULATORY_CARE_PROVIDER_SITE_OTHER): Payer: 59 | Admitting: Obstetrics and Gynecology

## 2021-11-23 ENCOUNTER — Encounter: Payer: Self-pay | Admitting: Obstetrics and Gynecology

## 2021-11-23 VITALS — BP 127/77 | HR 97 | Wt 143.9 lb

## 2021-11-23 DIAGNOSIS — O09899 Supervision of other high risk pregnancies, unspecified trimester: Secondary | ICD-10-CM

## 2021-11-23 DIAGNOSIS — Z3482 Encounter for supervision of other normal pregnancy, second trimester: Secondary | ICD-10-CM

## 2021-11-23 DIAGNOSIS — Z2839 Other underimmunization status: Secondary | ICD-10-CM

## 2021-11-23 DIAGNOSIS — R8271 Bacteriuria: Secondary | ICD-10-CM

## 2021-11-23 DIAGNOSIS — O09892 Supervision of other high risk pregnancies, second trimester: Secondary | ICD-10-CM

## 2021-11-23 DIAGNOSIS — O34219 Maternal care for unspecified type scar from previous cesarean delivery: Secondary | ICD-10-CM

## 2021-11-23 DIAGNOSIS — Z3A14 14 weeks gestation of pregnancy: Secondary | ICD-10-CM

## 2021-11-23 DIAGNOSIS — Z3A00 Weeks of gestation of pregnancy not specified: Secondary | ICD-10-CM | POA: Insufficient documentation

## 2021-11-23 NOTE — Patient Instructions (Signed)

## 2021-11-23 NOTE — Progress Notes (Signed)
°  Subjective:    Charlii Yost is a G3P1011 [redacted]w[redacted]d being seen today for her first obstetrical visit.  Her obstetrical history is significant for previous cesarean section due to chorioamnionitis at 7 cm per patient report. Patient does intend to breast feed. Pregnancy history fully reviewed.  Patient reports no complaints.  Vitals:   11/23/21 1109  BP: 127/77  Pulse: 97  Weight: 143 lb 14.4 oz (65.3 kg)    HISTORY: OB History  Gravida Para Term Preterm AB Living  3 1 1   1 1   SAB IAB Ectopic Multiple Live Births  1       1    # Outcome Date GA Lbr Len/2nd Weight Sex Delivery Anes PTL Lv  3 Current           2 SAB 06/2021          1 Term 01/04/13 [redacted]w[redacted]d  8 lb 6 oz (3.799 kg) M CS-LTranv   LIV   Past Medical History:  Diagnosis Date   Anxiety    Headache    tension   Past Surgical History:  Procedure Laterality Date   CESAREAN SECTION  01/04/2013   DILATION AND EVACUATION N/A 07/15/2021   Procedure: DILATATION AND EVACUATION;  Surgeon: 07/17/2021, MD;  Location: [facility];  Service: Gynecology;  Laterality: N/A;   Family History  Problem Relation Age of Onset   Cancer Paternal Grandmother      Exam    Uterus:   14-weeks  Pelvic Exam:    Perineum: No Hemorrhoids, Normal Perineum   Vulva: normal   Vagina:  normal mucosa, normal discharge   pH:    Cervix: multiparous appearance and cervix is closed and long   Adnexa: no mass, fullness, tenderness   Bony Pelvis: gynecoid  System: Breast:  normal appearance, no masses or tenderness   Skin: normal coloration and turgor, no rashes    Neurologic: oriented, no focal deficits   Extremities: normal strength, tone, and muscle mass   HEENT extra ocular movement intact   Mouth/Teeth mucous membranes moist, pharynx normal without lesions and dental hygiene good   Neck supple and no masses   Cardiovascular: regular rate and rhythm   Respiratory:  appears well, vitals normal, no respiratory distress,  acyanotic, normal RR, chest clear, no wheezing, crepitations, rhonchi, normal symmetric air entry   Abdomen: soft, non-tender; bowel sounds normal; no masses,  no organomegaly   Urinary:       Assessment:    Pregnancy: G3P1011 Patient Active Problem List   Diagnosis Date Noted   Previous cesarean delivery affecting pregnancy 11/23/2021   Supervision of normal pregnancy 10/30/2021   Missed abortion around nine weeks gestation 07/13/2021        Plan:     Initial labs drawn. Prenatal vitamins. Problem list reviewed and updated. Genetic Screening discussed : panorama today.  Ultrasound discussed; fetal survey: ordered. Patient with previous cesarean section for suspected chorio at 7 cm. Patient currently desires repeat cesarean section  Follow up in 4 weeks. 50% of 30 min visit spent on counseling and coordination of care.     Yasmyn Bellisario 11/23/2021

## 2021-11-23 NOTE — Progress Notes (Signed)
NOB in office reports that she has been having some pain that may be related to gas. Intake and U/S completed on 10/30/21

## 2021-11-24 DIAGNOSIS — Z2839 Other underimmunization status: Secondary | ICD-10-CM | POA: Insufficient documentation

## 2021-11-24 DIAGNOSIS — O09899 Supervision of other high risk pregnancies, unspecified trimester: Secondary | ICD-10-CM | POA: Insufficient documentation

## 2021-11-24 LAB — CBC/D/PLT+RPR+RH+ABO+RUBIGG...
Antibody Screen: NEGATIVE
Basophils Absolute: 0 10*3/uL (ref 0.0–0.2)
Basos: 0 %
EOS (ABSOLUTE): 0.1 10*3/uL (ref 0.0–0.4)
Eos: 1 %
HCV Ab: <0.1 s/co ratio (ref 0.0–0.9)
HIV Screen 4th Generation wRfx: NONREACTIVE
Hematocrit: 41.3 % (ref 34.0–46.6)
Hemoglobin: 14.1 g/dL (ref 11.1–15.9)
Hepatitis B Surface Ag: NEGATIVE
Immature Grans (Abs): 0 10*3/uL (ref 0.0–0.1)
Immature Granulocytes: 0 %
Lymphocytes Absolute: 1.2 10*3/uL (ref 0.7–3.1)
Lymphs: 20 %
MCH: 30.2 pg (ref 26.6–33.0)
MCHC: 34.1 g/dL (ref 31.5–35.7)
MCV: 88 fL (ref 79–97)
Monocytes Absolute: 0.6 10*3/uL (ref 0.1–0.9)
Monocytes: 11 %
Neutrophils Absolute: 4 10*3/uL (ref 1.4–7.0)
Neutrophils: 68 %
Platelets: 206 10*3/uL (ref 150–450)
RBC: 4.67 x10E6/uL (ref 3.77–5.28)
RDW: 11.9 % (ref 11.7–15.4)
RPR Ser Ql: NONREACTIVE
Rh Factor: POSITIVE
Rubella Antibodies, IGG: <0.9 index — ABNORMAL LOW (ref >0.99)
WBC: 5.9 10*3/uL (ref 3.4–10.8)

## 2021-11-24 LAB — CERVICOVAGINAL ANCILLARY ONLY
Chlamydia: NEGATIVE
Comment: NEGATIVE
Comment: NORMAL
Neisseria Gonorrhea: NEGATIVE

## 2021-11-24 LAB — HCV INTERPRETATION

## 2021-11-25 LAB — CYTOLOGY - PAP
Comment: NEGATIVE
Diagnosis: NEGATIVE
High risk HPV: NEGATIVE

## 2021-11-27 LAB — URINE CULTURE, OB REFLEX

## 2021-11-27 LAB — CULTURE, OB URINE

## 2021-11-30 DIAGNOSIS — R8271 Bacteriuria: Secondary | ICD-10-CM | POA: Insufficient documentation

## 2021-11-30 MED ORDER — CEPHALEXIN 500 MG PO CAPS: 500.0000 mg | ORAL_CAPSULE | Freq: Four times a day (QID) | ORAL | 2 refills | Status: DC

## 2021-11-30 NOTE — Addendum Note (Signed)
Addended by: Catalina Antigua on: 11/30/2021 09:30 AM   Modules accepted: Orders

## 2021-12-07 ENCOUNTER — Encounter: Payer: Self-pay | Admitting: Obstetrics and Gynecology

## 2021-12-10 ENCOUNTER — Encounter: Payer: Self-pay | Admitting: Obstetrics and Gynecology

## 2021-12-21 ENCOUNTER — Encounter: Payer: Self-pay | Admitting: Obstetrics and Gynecology

## 2021-12-23 ENCOUNTER — Encounter: Payer: Self-pay | Admitting: Obstetrics and Gynecology

## 2021-12-24 ENCOUNTER — Encounter: Payer: Self-pay | Admitting: Obstetrics and Gynecology

## 2021-12-24 ENCOUNTER — Ambulatory Visit (INDEPENDENT_AMBULATORY_CARE_PROVIDER_SITE_OTHER): Payer: BC Managed Care – PPO | Admitting: Obstetrics and Gynecology

## 2021-12-24 ENCOUNTER — Other Ambulatory Visit: Payer: Self-pay

## 2021-12-24 DIAGNOSIS — R8271 Bacteriuria: Secondary | ICD-10-CM

## 2021-12-24 DIAGNOSIS — Z3482 Encounter for supervision of other normal pregnancy, second trimester: Secondary | ICD-10-CM

## 2021-12-24 DIAGNOSIS — O34219 Maternal care for unspecified type scar from previous cesarean delivery: Secondary | ICD-10-CM

## 2021-12-24 DIAGNOSIS — O09899 Supervision of other high risk pregnancies, unspecified trimester: Secondary | ICD-10-CM

## 2021-12-24 DIAGNOSIS — Z2839 Other underimmunization status: Secondary | ICD-10-CM

## 2021-12-24 NOTE — Progress Notes (Signed)
? ?  PRENATAL VISIT NOTE ? ?Subjective:  ?Maria Meyer is a 31 y.o. G3P1011 at [redacted]w[redacted]d being seen today for ongoing prenatal care.  She is currently monitored for the following issues for this low-risk pregnancy and has Missed abortion around nine weeks gestation; Supervision of normal pregnancy; Previous cesarean delivery affecting pregnancy; Rubella non-immune status, antepartum; and GBS bacteriuria on their problem list. ? ?Patient doing well with no acute concerns today. She reports no complaints.  Contractions: Not present. Vag. Bleeding: None.   . Denies leaking of fluid.  ? ?The following portions of the patient's history were reviewed and updated as appropriate: allergies, current medications, past family history, past medical history, past social history, past surgical history and problem list. Problem list updated. ? ?Objective:  ? ?Vitals:  ? 12/24/21 1348  ?BP: 103/65  ?Pulse: 83  ?Weight: 155 lb (70.3 kg)  ? ? ?Fetal Status: Fetal Heart Rate (bpm): 149 Fundal Height: 19 cm      ? ?General:  Alert, oriented and cooperative. Patient is in no acute distress.  ?Skin: Skin is warm and dry. No rash noted.   ?Cardiovascular: Normal heart rate noted  ?Respiratory: Normal respiratory effort, no problems with respiration noted  ?Abdomen: Soft, gravid, appropriate for gestational age.  Pain/Pressure: Absent     ?Pelvic: Cervical exam deferred        ?Extremities: Normal range of motion.  Edema: None  ?Mental Status:  Normal mood and affect. Normal behavior. Normal judgment and thought content.  ? ?Assessment and Plan:  ?Pregnancy: G3P1011 at [redacted]w[redacted]d ? ?1. Encounter for supervision of other normal pregnancy in second trimester ?Continue normal care ?Repeat urine culture as patient did not finish first round of abx ? ?- AFP, Serum, Open Spina Bifida ?- Urine Culture ? ?2. Previous cesarean delivery affecting pregnancy ?Pt currently desires repeat c section ? ?3. Rubella non-immune status, antepartum ?Treat after  delivery ? ?4. GBS bacteriuria ?If pt decides on VBAC, pt will need abx in labor ? ?Preterm labor symptoms and general obstetric precautions including but not limited to vaginal bleeding, contractions, leaking of fluid and fetal movement were reviewed in detail with the patient. ? ?Please refer to After Visit Summary for other counseling recommendations.  ? ?Return for ROB, virtual. ? ? ?Mariel Aloe, MD ?Faculty Attending ?[facility] ?  ?

## 2021-12-26 LAB — AFP, SERUM, OPEN SPINA BIFIDA
AFP MoM: 0.83
AFP Value: 39.1 ng/mL
Gest. Age on Collection Date: 18.5 weeks
Maternal Age At EDD: 31.7 yr
OSBR Risk 1 IN: 10000
Test Results:: NEGATIVE
Weight: 155 [lb_av]

## 2021-12-26 LAB — URINE CULTURE: Organism ID, Bacteria: NO GROWTH

## 2021-12-29 ENCOUNTER — Ambulatory Visit: Payer: 59

## 2022-01-08 ENCOUNTER — Ambulatory Visit: Payer: 59 | Attending: Obstetrics and Gynecology

## 2022-01-08 ENCOUNTER — Other Ambulatory Visit: Payer: Self-pay

## 2022-01-08 DIAGNOSIS — Z363 Encounter for antenatal screening for malformations: Secondary | ICD-10-CM | POA: Insufficient documentation

## 2022-01-08 DIAGNOSIS — O321XX0 Maternal care for breech presentation, not applicable or unspecified: Secondary | ICD-10-CM | POA: Diagnosis not present

## 2022-01-08 DIAGNOSIS — Z3482 Encounter for supervision of other normal pregnancy, second trimester: Secondary | ICD-10-CM

## 2022-01-08 DIAGNOSIS — Z3A20 20 weeks gestation of pregnancy: Secondary | ICD-10-CM | POA: Diagnosis not present

## 2022-01-08 DIAGNOSIS — O34219 Maternal care for unspecified type scar from previous cesarean delivery: Secondary | ICD-10-CM | POA: Insufficient documentation

## 2022-01-21 ENCOUNTER — Telehealth (INDEPENDENT_AMBULATORY_CARE_PROVIDER_SITE_OTHER): Payer: 59 | Admitting: Obstetrics

## 2022-01-21 ENCOUNTER — Encounter: Payer: Self-pay | Admitting: Obstetrics

## 2022-01-21 DIAGNOSIS — R8271 Bacteriuria: Secondary | ICD-10-CM

## 2022-01-21 DIAGNOSIS — O09892 Supervision of other high risk pregnancies, second trimester: Secondary | ICD-10-CM

## 2022-01-21 DIAGNOSIS — Z3A22 22 weeks gestation of pregnancy: Secondary | ICD-10-CM

## 2022-01-21 DIAGNOSIS — O34219 Maternal care for unspecified type scar from previous cesarean delivery: Secondary | ICD-10-CM

## 2022-01-21 DIAGNOSIS — Z3482 Encounter for supervision of other normal pregnancy, second trimester: Secondary | ICD-10-CM

## 2022-01-21 DIAGNOSIS — Z2839 Other underimmunization status: Secondary | ICD-10-CM

## 2022-01-21 DIAGNOSIS — O99820 Streptococcus B carrier state complicating pregnancy: Secondary | ICD-10-CM

## 2022-01-21 NOTE — Progress Notes (Signed)
? ?OBSTETRICS PRENATAL VIRTUAL VISIT ENCOUNTER NOTE ? ?Provider location: [facility]  ? ?Patient location: Home ? ?I connected with [name] on 01/21/22 at  1:30 PM EDT by MyChart Video Encounter and verified that I am speaking with the correct person using two identifiers. I discussed the limitations, risks, security and privacy concerns of performing an evaluation and management service virtually and the availability of in person appointments. I also discussed with the patient that there may be a patient responsible charge related to this service. The patient expressed understanding and agreed to proceed. ?Subjective:  ?Maria Meyer is a 31 y.o. G3P1011 at [redacted]w[redacted]d being seen today for ongoing prenatal care.  She is currently monitored for the following issues for this low-risk pregnancy and has Missed abortion around nine weeks gestation; Supervision of normal pregnancy; Previous cesarean delivery affecting pregnancy; Rubella non-immune status, antepartum; and GBS bacteriuria on their problem list. ? ?Patient reports backache and heartburn.   .  .   . Denies any leaking of fluid.  ? ?The following portions of the patient's history were reviewed and updated as appropriate: allergies, current medications, past family history, past medical history, past social history, past surgical history and problem list.  ? ?Objective:  ?There were no vitals filed for this visit. ? ?Fetal Status:          ? ?General:  Alert, oriented and cooperative. Patient is in no acute distress.  ?Respiratory: Normal respiratory effort, no problems with respiration noted  ?Mental Status: Normal mood and affect. Normal behavior. Normal judgment and thought content.  ?Rest of physical exam deferred due to type of encounter ? ?Imaging: ?[place] MFM OB COMP + 14 WK ? ?Result Date: 01/08/2022 ?----------------------------------------------------------------------  OBSTETRICS REPORT                       (Signed Final  01/08/2022 02:55 pm) ---------------------------------------------------------------------- Patient Info  ID #:       [id number]                          D.O.B.:  July 30, 1991 (30 yrs)  Name:       Maria Meyer                  Visit Date: 01/08/2022 02:15 pm ---------------------------------------------------------------------- Performed By  Attending:        Noralee Space MD        Ref. Address:      [address]                                                              [place], [place]  [id number]  Performed By:     Clayton LefortAnna Cressi RDMS       [facility]  Referred By:      Hermina StaggersMICHAEL L ERVIN                    MD ---------------------------------------------------------------------- Orders  #  Description                           Code        Ordered By  1  US MFM OB COMP + 14 WK                76805.01    PEGGY CONSTANT ----------------------------------------------------------------------  #  Order #                     Accession #                Episode #  1  [id number]                   [id number]                 [id number] ---------------------------------------------------------------------- Indications  [redacted] weeks gestation of pregnancy                 Z3A.20  Previous cesarean delivery, antepartum          O34.219  Encounter for antenatal screening for           Z36.3  malformations  LR NIPS, Neg Horizon, Neg AFP ---------------------------------------------------------------------- Fetal Evaluation  Num Of Fetuses:          1  Fetal Heart Rate(bpm):   132  Cardiac Activity:        Observed  Presentation:            Breech  Placenta:                Posterior   P. Cord Insertion:       Visualized, central  Amniotic Fluid  AFI FV:      Within normal limits                              Largest Pocket(cm)  5.17 ---------------------------------------------------------------------- Biometry  BPD:      48.9  mm     G. Age:  20w 6d         65  %    CI:        75.69   %    70 - 86                                                          FL/HC:       17.5  %    16.8 - 19.8  HC:      178.2  mm     G. Age:  20w 2d         35  %    HC/AC:       1.16       1.09 - 1.39  AC:      153.6  mm     G. Age:  20w 4d         48  %    FL/BPD:      63.8  %  FL:       31.2  mm     G. Age:  19w 5d         18  %    FL/AC:       20.3  %    20 - 24  HUM:        30  mm     G. Age:  19w 6d         36  %  CER:      21.8  mm     G. Age:  20w 4d         71  %  NFT:       5.1  mm  LV:        6.1  mm  CM:        6.5  mm  Est. FW:     338   gm   0 lb 12 oz      32  % ---------------------------------------------------------------------- Gestational Age  U/S Today:     20w 3d                                        EDD:   05/25/22  Best:          Cherylann Parr 3d     Det. By:  U/S C R L  (10/30/21)    EDD:   05/25/22 ---------------------------------------------------------------------- Anatomy  Cranium:               Appears normal         Aortic Arch:            Appears normal  Cavum:                 Appears normal         Ductal Arch:            Appears normal  Ventricles:            Appears normal         Diaphragm:  Appears normal  Choroid Plexus:        Appears normal         Stomach:                Appears normal, left                                                                        sided  Cerebellum:            Appears normal         Abdomen:                Appears normal  Posterior Fossa:       Appears normal         Abdominal Wall:         Appears nml (cord                                                                        insert, abd wall)  Nuchal Fold:            Appears normal         Cord Vessels:           Appears normal (3                                                                        vessel cord)  Face:                  Appears normal         Kidneys:                Appear normal                         (orbits and profile)  Lips:                  Appears normal         Bladder:                Appears normal  Thoracic:              Appears normal         Spine:                  Appears normal  Heart:                 Appears normal         Upper Extremities:      Appears normal                         (4CH,  axis, and                         situs)  RVOT:                  Appears normal         Lower Extremities:      Appears normal  LVOT:                  Appears normal  Other:  VC, 3VV and 3VTV visualized. Fetus appears to be a female. ---------------------------------------------------------------------- Cervix Uterus Adnexa  Cervix  Length:           4.03  cm.  Normal appearance by transabdominal scan.  Right Ovary  Visualized.  Left Ovary  Visualized. ---------------------------------------------------------------------- Impression  G3 P1.  Patient is here for fetal anatomy scan.  On cell-free  fetal DNA screening, the risks of fetal aneuploidies are not  increased.MSAFP screening showed low risk for open-neural  tube defects .  Obstetric history significant for a term cesarean delivery.  We performed fetal anatomy scan. No makers of  aneuploidies or fetal structural defects are seen. Fetal  biometry is consistent with her previously-established dates.  Amniotic fluid is normal and good fetal activity is seen.  Placenta is posterior and there is no evidence of previa or  placenta accreta spectrum.Patient understands the  limitations of ultrasound in detecting fetal anomalies. ---------------------------------------------------------------------- Recommendations  Follow-up scans as clinically indicated.  ----------------------------------------------------------------------                 Noralee Space, MD Electronically Signed Final Report   01/08/2022 02:55 pm ----------------------------------------------------------------------  ? ?Assessment and Plan:  ?

## 2022-02-18 ENCOUNTER — Ambulatory Visit (INDEPENDENT_AMBULATORY_CARE_PROVIDER_SITE_OTHER): Payer: 59 | Admitting: Obstetrics and Gynecology

## 2022-02-18 ENCOUNTER — Other Ambulatory Visit: Payer: 59

## 2022-02-18 ENCOUNTER — Encounter: Payer: Self-pay | Admitting: Obstetrics and Gynecology

## 2022-02-18 VITALS — BP 115/71 | HR 74 | Wt 163.3 lb

## 2022-02-18 DIAGNOSIS — O99612 Diseases of the digestive system complicating pregnancy, second trimester: Secondary | ICD-10-CM

## 2022-02-18 DIAGNOSIS — O09899 Supervision of other high risk pregnancies, unspecified trimester: Secondary | ICD-10-CM

## 2022-02-18 DIAGNOSIS — R8271 Bacteriuria: Secondary | ICD-10-CM

## 2022-02-18 DIAGNOSIS — Z3482 Encounter for supervision of other normal pregnancy, second trimester: Secondary | ICD-10-CM

## 2022-02-18 DIAGNOSIS — Z2839 Other underimmunization status: Secondary | ICD-10-CM

## 2022-02-18 DIAGNOSIS — K219 Gastro-esophageal reflux disease without esophagitis: Secondary | ICD-10-CM

## 2022-02-18 DIAGNOSIS — Z3A26 26 weeks gestation of pregnancy: Secondary | ICD-10-CM

## 2022-02-18 DIAGNOSIS — O34219 Maternal care for unspecified type scar from previous cesarean delivery: Secondary | ICD-10-CM

## 2022-02-18 MED ORDER — PANTOPRAZOLE SODIUM 20 MG PO TBEC: 20.0000 mg | DELAYED_RELEASE_TABLET | Freq: Every day | ORAL | 1 refills | Status: DC

## 2022-02-18 NOTE — Progress Notes (Signed)
? ?  PRENATAL VISIT NOTE ? ?Subjective:  ?Jaquayla Devereux is a 31 y.o. G3P1011 at [redacted]w[redacted]d being seen today for ongoing prenatal care.  She is currently monitored for the following issues for this low-risk pregnancy and has Missed abortion around nine weeks gestation; Supervision of normal pregnancy; Previous cesarean delivery affecting pregnancy; Rubella non-immune status, antepartum; GBS bacteriuria; and Gastroesophageal reflux during pregnancy in second trimester, antepartum on their problem list. ? ?Patient doing well with no acute concerns today. She reports heartburn.  Contractions: Not present. Vag. Bleeding: None.  Movement: Present. Denies leaking of fluid.  ? ?The following portions of the patient's history were reviewed and updated as appropriate: allergies, current medications, past family history, past medical history, past social history, past surgical history and problem list. Problem list updated. ? ?Objective:  ? ?Vitals:  ? 02/18/22 1050  ?BP: 115/71  ?Pulse: 74  ?Weight: 74.1 kg  ? ? ?Fetal Status: Fetal Heart Rate (bpm): 138  Fundal Height: 26 cm Movement: Present    ? ?General:  Alert, oriented and cooperative. Patient is in no acute distress.  ?Skin: Skin is warm and dry. No rash noted.   ?Cardiovascular: Normal heart rate noted  ?Respiratory: Normal respiratory effort, no problems with respiration noted  ?Abdomen: Soft, gravid, appropriate for gestational age.  Pain/Pressure: Present     ?Pelvic: Cervical exam deferred        ?Extremities: Normal range of motion.  Edema: None  ?Mental Status:  Normal mood and affect. Normal behavior. Normal judgment and thought content.  ? ?Assessment and Plan:  ?Pregnancy: G3P1011 at [redacted]w[redacted]d ? ?1. Rubella non-immune status, antepartum ?Treat after delivery ? ?2. Previous cesarean delivery affecting pregnancy ?Pt desires repeat c section, verify with consent next visit ? ?3. Encounter for supervision of other normal pregnancy in second trimester ?Continue routine  prenatal care ? ?4. GBS bacteriuria ?Pt is receiving cesarean section, but if VBAC will need prophylaxis ? ?5. [redacted] weeks gestation of pregnancy ? ? ?6. Gastroesophageal reflux during pregnancy in second trimester, antepartum ?Pt has tried maalox with little effect ?- pantoprazole (PROTONIX) 20 MG tablet; Take 1 tablet (20 mg total) by mouth daily.  Dispense: 30 tablet; Refill: 1 ? ?Preterm labor symptoms and general obstetric precautions including but not limited to vaginal bleeding, contractions, leaking of fluid and fetal movement were reviewed in detail with the patient. ? ?Please refer to After Visit Summary for other counseling recommendations.  ? ?Return in about 2 weeks (around 03/04/2022) for ROB, in person, 2 hr GTT, 3rd trim labs. ? ? ?Lynnda Shields, MD ?Faculty Attending ?[facility] [place] ?  ?

## 2022-02-22 ENCOUNTER — Other Ambulatory Visit: Payer: 59

## 2022-03-04 ENCOUNTER — Encounter: Payer: 59 | Admitting: Obstetrics

## 2022-03-10 ENCOUNTER — Other Ambulatory Visit: Payer: 59

## 2022-03-11 ENCOUNTER — Other Ambulatory Visit: Payer: Self-pay | Admitting: *Deleted

## 2022-03-11 ENCOUNTER — Other Ambulatory Visit: Payer: 59

## 2022-03-11 DIAGNOSIS — Z3A29 29 weeks gestation of pregnancy: Secondary | ICD-10-CM

## 2022-03-12 LAB — GLUCOSE TOLERANCE, 2 HOURS W/ 1HR
Glucose, 1 hour: 86 mg/dL (ref 70–179)
Glucose, 2 hour: 86 mg/dL (ref 70–152)
Glucose, Fasting: 77 mg/dL (ref 70–91)

## 2022-03-12 LAB — CBC
Hematocrit: 36.3 % (ref 34.0–46.6)
Hemoglobin: 11.8 g/dL (ref 11.1–15.9)
MCH: 29.6 pg (ref 26.6–33.0)
MCHC: 32.5 g/dL (ref 31.5–35.7)
MCV: 91 fL (ref 79–97)
Platelets: 211 10*3/uL (ref 150–450)
RBC: 3.99 x10E6/uL (ref 3.77–5.28)
RDW: 11.8 % (ref 11.7–15.4)
WBC: 7.5 10*3/uL (ref 3.4–10.8)

## 2022-03-12 LAB — RPR: RPR Ser Ql: NONREACTIVE

## 2022-03-12 LAB — HIV ANTIBODY (ROUTINE TESTING W REFLEX): HIV Screen 4th Generation wRfx: NONREACTIVE

## 2022-03-17 ENCOUNTER — Ambulatory Visit (INDEPENDENT_AMBULATORY_CARE_PROVIDER_SITE_OTHER): Payer: 59 | Admitting: Obstetrics and Gynecology

## 2022-03-17 ENCOUNTER — Encounter: Payer: Self-pay | Admitting: Obstetrics and Gynecology

## 2022-03-17 VITALS — BP 126/84 | HR 97 | Wt 167.9 lb

## 2022-03-17 DIAGNOSIS — Z3A30 30 weeks gestation of pregnancy: Secondary | ICD-10-CM

## 2022-03-17 DIAGNOSIS — Z23 Encounter for immunization: Secondary | ICD-10-CM | POA: Diagnosis not present

## 2022-03-17 DIAGNOSIS — R8271 Bacteriuria: Secondary | ICD-10-CM

## 2022-03-17 DIAGNOSIS — Z3009 Encounter for other general counseling and advice on contraception: Secondary | ICD-10-CM

## 2022-03-17 DIAGNOSIS — Z2839 Other underimmunization status: Secondary | ICD-10-CM

## 2022-03-17 DIAGNOSIS — Z3482 Encounter for supervision of other normal pregnancy, second trimester: Secondary | ICD-10-CM

## 2022-03-17 DIAGNOSIS — O34219 Maternal care for unspecified type scar from previous cesarean delivery: Secondary | ICD-10-CM

## 2022-03-17 DIAGNOSIS — O09899 Supervision of other high risk pregnancies, unspecified trimester: Secondary | ICD-10-CM

## 2022-03-17 NOTE — Progress Notes (Signed)
Pt presents for ROB visit.  

## 2022-03-17 NOTE — Progress Notes (Signed)
Subjective:  Maria Meyer is a 31 y.o. G3P1011 at [redacted]w[redacted]d being seen today for ongoing prenatal care.  She is currently monitored for the following issues for this low-risk pregnancy and has Supervision of normal pregnancy; Previous cesarean delivery affecting pregnancy; Rubella non-immune status, antepartum; GBS bacteriuria; Gastroesophageal reflux during pregnancy in second trimester, antepartum; and Unwanted fertility on their problem list.  Patient reports general discomforts of pregnancy.  Contractions: Irritability. Vag. Bleeding: None.  Movement: Present. Denies leaking of fluid.   The following portions of the patient's history were reviewed and updated as appropriate: allergies, current medications, past family history, past medical history, past social history, past surgical history and problem list. Problem list updated.  Objective:   Vitals:   03/17/22 1544  BP: 126/84  Pulse: 97  Weight: 76.2 kg    Fetal Status: Fetal Heart Rate (bpm): 145   Movement: Present     General:  Alert, oriented and cooperative. Patient is in no acute distress.  Skin: Skin is warm and dry. No rash noted.   Cardiovascular: Normal heart rate noted  Respiratory: Normal respiratory effort, no problems with respiration noted  Abdomen: Soft, gravid, appropriate for gestational age. Pain/Pressure: Absent     Pelvic:  Cervical exam deferred        Extremities: Normal range of motion.  Edema: Trace  Mental Status: Normal mood and affect. Normal behavior. Normal judgment and thought content.   Urinalysis:      Assessment and Plan:  Pregnancy: G3P1011 at [redacted]w[redacted]d  1. Encounter for supervision of other normal pregnancy in second trimester Stable  2. Rubella non-immune status, antepartum Vaccine P  3. Previous cesarean delivery affecting pregnancy Desires repeat  Consented today  4. GBS bacteriuria Tx while in labor  5. Unwanted fertility BTL papers signed today  Preterm labor symptoms and  general obstetric precautions including but not limited to vaginal bleeding, contractions, leaking of fluid and fetal movement were reviewed in detail with the patient. Please refer to After Visit Summary for other counseling recommendations.  Return in about 2 weeks (around 03/31/2022) for OB visit, face to face, any provider.   Hermina Staggers, MD

## 2022-03-17 NOTE — Patient Instructions (Signed)

## 2022-03-17 NOTE — Progress Notes (Signed)
Patient presents for ROB. 

## 2022-03-31 ENCOUNTER — Encounter: Payer: Self-pay | Admitting: Obstetrics and Gynecology

## 2022-03-31 ENCOUNTER — Telehealth (INDEPENDENT_AMBULATORY_CARE_PROVIDER_SITE_OTHER): Payer: 59 | Admitting: Obstetrics and Gynecology

## 2022-03-31 DIAGNOSIS — Z3009 Encounter for other general counseling and advice on contraception: Secondary | ICD-10-CM

## 2022-03-31 DIAGNOSIS — O34219 Maternal care for unspecified type scar from previous cesarean delivery: Secondary | ICD-10-CM

## 2022-03-31 DIAGNOSIS — Z3A32 32 weeks gestation of pregnancy: Secondary | ICD-10-CM

## 2022-03-31 DIAGNOSIS — R8271 Bacteriuria: Secondary | ICD-10-CM

## 2022-03-31 DIAGNOSIS — Z2839 Other underimmunization status: Secondary | ICD-10-CM

## 2022-03-31 DIAGNOSIS — Z3482 Encounter for supervision of other normal pregnancy, second trimester: Secondary | ICD-10-CM

## 2022-03-31 DIAGNOSIS — Z3483 Encounter for supervision of other normal pregnancy, third trimester: Secondary | ICD-10-CM

## 2022-03-31 NOTE — Patient Instructions (Signed)

## 2022-03-31 NOTE — Progress Notes (Signed)
   OBSTETRICS PRENATAL VIRTUAL VISIT ENCOUNTER NOTE  Provider location: [facility] [place] [facility]   Patient location: Home  I connected with Maria Meyer on 03/31/22 at  4:10 PM EDT by MyChart Video Encounter and verified that I am speaking with the correct person using two identifiers. I discussed the limitations, risks, security and privacy concerns of performing an evaluation and management service virtually and the availability of in person appointments. I also discussed with the patient that there may be a patient responsible charge related to this service. The patient expressed understanding and agreed to proceed. Subjective:  Maria Meyer is a 31 y.o. G3P1011 at [redacted]w[redacted]d being seen today for ongoing prenatal care.  She is currently monitored for the following issues for this low-risk pregnancy and has Supervision of normal pregnancy; Previous cesarean delivery affecting pregnancy; Rubella non-immune status, antepartum; GBS bacteriuria; Gastroesophageal reflux during pregnancy in second trimester, antepartum; and Unwanted fertility on their problem list.  Patient reports backache.  Contractions: Not present. Vag. Bleeding: None.  Movement: Present. Denies any leaking of fluid.   The following portions of the patient's history were reviewed and updated as appropriate: allergies, current medications, past family history, past medical history, past social history, past surgical history and problem list.   Objective:  There were no vitals filed for this visit.  Fetal Status:     Movement: Present     General:  Alert, oriented and cooperative. Patient is in no acute distress.  Respiratory: Normal respiratory effort, no problems with respiration noted  Mental Status: Normal mood and affect. Normal behavior. Normal judgment and thought content.  Rest of physical exam deferred due to type of encounter  Imaging: No results found.  Assessment and Plan:  Pregnancy: G3P1011 at  [redacted]w[redacted]d 1. Rubella non-immune status, antepartum Stable   2. Previous cesarean delivery affecting pregnancy For repeat  3. Encounter for supervision of other normal pregnancy in second trimester Stable  4. GBS bacteriuria Tx while in labor  5. Unwanted fertility Consented  Preterm labor symptoms and general obstetric precautions including but not limited to vaginal bleeding, contractions, leaking of fluid and fetal movement were reviewed in detail with the patient. I discussed the assessment and treatment plan with the patient. The patient was provided an opportunity to ask questions and all were answered. The patient agreed with the plan and demonstrated an understanding of the instructions. The patient was advised to call back or seek an in-person office evaluation/go to [facility] at [facility] for any urgent or concerning symptoms. Please refer to After Visit Summary for other counseling recommendations.   I provided 8 minutes of face-to-face time during this encounter.  Return in about 2 weeks (around 04/14/2022) for OB visit, face to face, any provider.  No future appointments.  Chancy Milroy, MD [facility] [place], [place]

## 2022-03-31 NOTE — Progress Notes (Signed)
Pt is doing well, no concerns today. Pt is not able to check BP for today's visit.

## 2022-04-01 ENCOUNTER — Encounter: Payer: Self-pay | Admitting: *Deleted

## 2022-04-21 ENCOUNTER — Ambulatory Visit (INDEPENDENT_AMBULATORY_CARE_PROVIDER_SITE_OTHER): Payer: 59 | Admitting: Obstetrics and Gynecology

## 2022-04-21 VITALS — BP 121/82 | HR 96 | Wt 173.0 lb

## 2022-04-21 DIAGNOSIS — Z3A35 35 weeks gestation of pregnancy: Secondary | ICD-10-CM

## 2022-04-21 DIAGNOSIS — K219 Gastro-esophageal reflux disease without esophagitis: Secondary | ICD-10-CM

## 2022-04-21 DIAGNOSIS — O99612 Diseases of the digestive system complicating pregnancy, second trimester: Secondary | ICD-10-CM

## 2022-04-21 DIAGNOSIS — O34219 Maternal care for unspecified type scar from previous cesarean delivery: Secondary | ICD-10-CM

## 2022-04-21 MED ORDER — ABDOMINAL BINDER/ELASTIC XL MISC: 1.0000 [IU] | Freq: Every day | 0 refills | Status: AC

## 2022-04-21 MED ORDER — PANTOPRAZOLE SODIUM 20 MG PO TBEC: 20.0000 mg | DELAYED_RELEASE_TABLET | Freq: Two times a day (BID) | ORAL | 0 refills | Status: AC

## 2022-04-21 NOTE — Progress Notes (Signed)
Patient presents for ROB. Patient complains of having sciatic nerve pain on her right side. Patient desires rx for maternity belt.

## 2022-04-21 NOTE — Patient Instructions (Signed)
   PREGNANCY SUPPORT BELT: You are not alone, Seventy-five percent of women have some sort of abdominal or back pain at some point in their pregnancy. Your baby is growing at a fast pace, which means that your whole body is rapidly trying to adjust to the changes. As your uterus grows, your back may start feeling a bit under stress and this can result in back or abdominal pain that can go from mild, and therefore bearable, to severe pains that will not allow you to sit or lay down comfortably, When it comes to dealing with pregnancy-related pains and cramps, some pregnant women usually prefer natural remedies, which the market is filled with nowadays. For example, wearing a pregnancy support belt can help ease and lessen your discomfort and pain. WHAT ARE THE BENEFITS OF WEARING A PREGNANCY SUPPORT BELT? A pregnancy support belt provides support to the lower portion of the belly taking some of the weight of the growing uterus and distributing to the other parts of your body. It is designed make you comfortable and gives you extra support. Over the years, the pregnancy apparel market has been studying the needs and wants of pregnant women and they have come up with the most comfortable pregnancy support belts that woman could ever ask for. In fact, you will no longer have to wear a stretched-out or bulky pregnancy belt that is visible underneath your clothes and makes you feel even more uncomfortable. Nowadays, a pregnancy support belt is made of comfortable and stretchy materials that will not irritate your skin but will actually make you feel at ease and you will not even notice you are wearing it. They are easy to put on and adjust during the day and can be worn at night for additional support.  BENEFITS: Relives Back pain Relieves Abdominal Muscle and Leg Pain Stabilizes the Pelvic Ring Offers a Cushioned Abdominal Lift Pad Relieves pressure on the Sciatic Nerve Within Minutes   Locations that Carry  Maternity/Pregnancy Support Belts  These locations will file your insurance, including [place] Medicaid:  [facility] [address] [place], [place] [address] [phone]  [facility] [address]  [place], [place] [address] [phone]  [facility] [address] , [place] [address] [phone]  [facility] [address] [place],  [address] [phone]  If you have any problems getting the belt, let your [facility] office know.  

## 2022-04-21 NOTE — Progress Notes (Signed)
   PRENATAL VISIT NOTE  Subjective:  Maria Meyer is a 31 y.o. G3P1011 at [redacted]w[redacted]d being seen today for ongoing prenatal care.  She is currently monitored for the following issues for this low-risk pregnancy and has Supervision of normal pregnancy; Previous cesarean delivery affecting pregnancy; Rubella non-immune status, antepartum; GBS bacteriuria; Gastroesophageal reflux during pregnancy in second trimester, antepartum; and Unwanted fertility on their problem list.  Patient reports  sciatic pain and GERD .  Contractions: Not present. Vag. Bleeding: None.  Movement: Present. Denies leaking of fluid.   The following portions of the patient's history were reviewed and updated as appropriate: allergies, current medications, past family history, past medical history, past social history, past surgical history and problem list.   Objective:   Vitals:   04/21/22 1602  BP: 121/82  Pulse: 96  Weight: 173 lb (78.5 kg)    Fetal Status: Fetal Heart Rate (bpm): 140 Fundal Height: 35 cm Movement: Present  Presentation: Vertex  General:  Alert, oriented and cooperative. Patient is in no acute distress.  Skin: Skin is warm and dry. No rash noted.   Cardiovascular: Normal heart rate noted  Respiratory: Normal respiratory effort, no problems with respiration noted  Abdomen: Soft, gravid, appropriate for gestational age.  Pain/Pressure: Absent     Pelvic: Cervical exam deferred        Extremities: Normal range of motion.  Edema: None  Mental Status: Normal mood and affect. Normal behavior. Normal judgment and thought content.   Assessment and Plan:  Pregnancy: G3P1011 at [redacted]w[redacted]d 1. [redacted] weeks gestation of pregnancy Recommend increase protonix 20 qday to bid Pregnancy belt sent in and also recommend exercises, stretches and prenatal massage to help GBS+  2. Previous cesarean delivery affecting pregnancy Scheduled for rpt and possible btl for 7/29  3. Gastroesophageal reflux during pregnancy in  second trimester, antepartum - pantoprazole (PROTONIX) 20 MG tablet; Take 1 tablet (20 mg total) by mouth 2 (two) times daily.  Dispense: 60 tablet; Refill: 0  Preterm labor symptoms and general obstetric precautions including but not limited to vaginal bleeding, contractions, leaking of fluid and fetal movement were reviewed in detail with the patient. Please refer to After Visit Summary for other counseling recommendations.   Return in about 1 week (around 04/28/2022) for low risk ob, in person, md or app.  Future Appointments  Date Time Provider Department Center  04/28/2022  8:15 AM Levie Heritage, DO [facility] None    [place] Bing, MD

## 2022-04-28 ENCOUNTER — Encounter: Payer: Self-pay | Admitting: Family Medicine

## 2022-04-28 ENCOUNTER — Telehealth (INDEPENDENT_AMBULATORY_CARE_PROVIDER_SITE_OTHER): Payer: 59 | Admitting: Family Medicine

## 2022-04-28 DIAGNOSIS — Z2839 Other underimmunization status: Secondary | ICD-10-CM

## 2022-04-28 DIAGNOSIS — R8271 Bacteriuria: Secondary | ICD-10-CM

## 2022-04-28 DIAGNOSIS — O09893 Supervision of other high risk pregnancies, third trimester: Secondary | ICD-10-CM

## 2022-04-28 DIAGNOSIS — O99820 Streptococcus B carrier state complicating pregnancy: Secondary | ICD-10-CM

## 2022-04-28 DIAGNOSIS — O09899 Supervision of other high risk pregnancies, unspecified trimester: Secondary | ICD-10-CM

## 2022-04-28 DIAGNOSIS — Z3483 Encounter for supervision of other normal pregnancy, third trimester: Secondary | ICD-10-CM

## 2022-04-28 DIAGNOSIS — Z3A36 36 weeks gestation of pregnancy: Secondary | ICD-10-CM

## 2022-04-28 DIAGNOSIS — O34219 Maternal care for unspecified type scar from previous cesarean delivery: Secondary | ICD-10-CM

## 2022-04-28 NOTE — Progress Notes (Signed)
Virtual Visit via Telephone Note  I connected with Maria Meyer on 04/28/22 at  8:15 AM EDT by telephone and verified that I am speaking with the correct person using two identifiers.  Location: Patient: home Provider: [facility]   Pt does not have a BP cuff at home.

## 2022-04-28 NOTE — Progress Notes (Signed)
   OBSTETRICS PRENATAL VIRTUAL VISIT ENCOUNTER NOTE  Provider location: [facility] [place] [facility]   Patient location: Home  I connected with Latoya Woodlief on 04/28/22 at  8:15 AM EDT by MyChart Video Encounter and verified that I am speaking with the correct person using two identifiers. I discussed the limitations, risks, security and privacy concerns of performing an evaluation and management service virtually and the availability of in person appointments. I also discussed with the patient that there may be a patient responsible charge related to this service. The patient expressed understanding and agreed to proceed. Subjective:  Kaitlynne Wenz is a 31 y.o. G3P1011 at [redacted]w[redacted]d being seen today for ongoing prenatal care.  She is currently monitored for the following issues for this low-risk pregnancy and has Supervision of normal pregnancy; Previous cesarean delivery affecting pregnancy; Rubella non-immune status, antepartum; GBS bacteriuria; Gastroesophageal reflux during pregnancy in second trimester, antepartum; and Unwanted fertility on their problem list.  Patient reports no complaints.  Contractions: Not present. Vag. Bleeding: None.  Movement: Present. Denies any leaking of fluid.   The following portions of the patient's history were reviewed and updated as appropriate: allergies, current medications, past family history, past medical history, past social history, past surgical history and problem list.   Objective:  There were no vitals filed for this visit.  Fetal Status:     Movement: Present     General:  Alert, oriented and cooperative. Patient is in no acute distress.  Respiratory: Normal respiratory effort, no problems with respiration noted  Mental Status: Normal mood and affect. Normal behavior. Normal judgment and thought content.  Rest of physical exam deferred due to type of encounter  Imaging: No results found.  Assessment and Plan:  Pregnancy: G3P1011  at [redacted]w[redacted]d 1. Rubella non-immune status, antepartum MMR post delivery  2. Previous cesarean delivery affecting pregnancy Desires RLTCS - scheduled for 39 weeks (7/29) No questions regarding c/s.  Does want BTL  3. Encounter for supervision of other normal pregnancy in third trimester  4. GBS bacteriuria No rpt necessary, but needs GC/CT next appt  Preterm labor symptoms and general obstetric precautions including but not limited to vaginal bleeding, contractions, leaking of fluid and fetal movement were reviewed in detail with the patient. I discussed the assessment and treatment plan with the patient. The patient was provided an opportunity to ask questions and all were answered. The patient agreed with the plan and demonstrated an understanding of the instructions. The patient was advised to call back or seek an in-person office evaluation/go to [facility] at [facility] for any urgent or concerning symptoms. Please refer to After Visit Summary for other counseling recommendations.   I provided 11 minutes of face-to-face time during this encounter.  Return in about 1 week (around 05/05/2022) for LR OB f/u, needs GC/CT cultures.  No future appointments.  [place] for [employer], [place]

## 2022-05-03 ENCOUNTER — Encounter (HOSPITAL_COMMUNITY): Payer: Self-pay

## 2022-05-03 ENCOUNTER — Telehealth (HOSPITAL_COMMUNITY): Payer: Self-pay | Admitting: *Deleted

## 2022-05-03 NOTE — Patient Instructions (Signed)
Maria Meyer  05/03/2022   Your procedure is scheduled on:  05/15/2022  Arrive at 0730 at [employer] [facility] on [address] at [facility]  and [employer]. You are invited to use the FREE valet parking or use the Visitor's parking deck.  Pick up the phone at the desk and dial [phone].  Call this number if you have problems the morning of surgery: [phone]  Remember:   Do not eat food:(After Midnight) Desps de medianoche.  Do not drink clear liquids: (After Midnight) Desps de medianoche.  Take these medicines the morning of surgery with A SIP OF WATER:  Take pantoprazole as prescribed   Do not wear jewelry, make-up or nail polish.  Do not wear lotions, powders, or perfumes. Do not wear deodorant.  Do not shave 48 hours prior to surgery.  Do not bring valuables to the hospital.  [facility] is not   responsible for any belongings or valuables brought to the hospital.  Contacts, dentures or bridgework may not be worn into surgery.  Leave suitcase in the car. After surgery it may be brought to your room.  For patients admitted to the hospital, checkout time is 11:00 AM the day of              discharge.      Please read over the following fact sheets that you were given:     Preparing for Surgery

## 2022-05-03 NOTE — Telephone Encounter (Signed)
Preadmission screen  

## 2022-05-04 ENCOUNTER — Telehealth (HOSPITAL_COMMUNITY): Payer: Self-pay | Admitting: *Deleted

## 2022-05-04 NOTE — Telephone Encounter (Signed)
Preadmission screen  

## 2022-05-05 ENCOUNTER — Encounter: Payer: 59 | Admitting: Medical

## 2022-05-05 ENCOUNTER — Telehealth (HOSPITAL_COMMUNITY): Payer: Self-pay | Admitting: *Deleted

## 2022-05-05 NOTE — Telephone Encounter (Signed)
Preadmission screen  

## 2022-05-06 ENCOUNTER — Telehealth (HOSPITAL_COMMUNITY): Payer: Self-pay | Admitting: *Deleted

## 2022-05-06 ENCOUNTER — Encounter: Payer: 59 | Admitting: Obstetrics and Gynecology

## 2022-05-06 NOTE — Telephone Encounter (Signed)
Preadmission screen  

## 2022-05-07 ENCOUNTER — Encounter (HOSPITAL_COMMUNITY): Payer: Self-pay

## 2022-05-12 ENCOUNTER — Ambulatory Visit (INDEPENDENT_AMBULATORY_CARE_PROVIDER_SITE_OTHER): Payer: No Typology Code available for payment source | Admitting: Obstetrics and Gynecology

## 2022-05-12 ENCOUNTER — Encounter: Payer: Self-pay | Admitting: Obstetrics and Gynecology

## 2022-05-12 VITALS — BP 124/76 | HR 79 | Wt 179.5 lb

## 2022-05-12 DIAGNOSIS — O34219 Maternal care for unspecified type scar from previous cesarean delivery: Secondary | ICD-10-CM

## 2022-05-12 DIAGNOSIS — Z3483 Encounter for supervision of other normal pregnancy, third trimester: Secondary | ICD-10-CM

## 2022-05-12 DIAGNOSIS — R8271 Bacteriuria: Secondary | ICD-10-CM

## 2022-05-12 DIAGNOSIS — O09899 Supervision of other high risk pregnancies, unspecified trimester: Secondary | ICD-10-CM

## 2022-05-12 DIAGNOSIS — Z2839 Other underimmunization status: Secondary | ICD-10-CM

## 2022-05-12 NOTE — Progress Notes (Signed)
   PRENATAL VISIT NOTE  Subjective:  Maria Meyer is a 31 y.o. G3P1011 at [redacted]w[redacted]d being seen today for ongoing prenatal care.  She is currently monitored for the following issues for this low-risk pregnancy and has Supervision of normal pregnancy; Previous cesarean delivery affecting pregnancy; Rubella non-immune status, antepartum; GBS bacteriuria; Gastroesophageal reflux during pregnancy in second trimester, antepartum; and Unwanted fertility on their problem list.  Patient reports no complaints.  Contractions: Irritability. Vag. Bleeding: None.  Movement: Present. Denies leaking of fluid.   The following portions of the patient's history were reviewed and updated as appropriate: allergies, current medications, past family history, past medical history, past social history, past surgical history and problem list.   Objective:   Vitals:   05/12/22 1031  BP: 124/76  Pulse: 79  Weight: 179 lb 8 oz (81.4 kg)    Fetal Status: Fetal Heart Rate (bpm): 137 Fundal Height: 39 cm Movement: Present     General:  Alert, oriented and cooperative. Patient is in no acute distress.  Skin: Skin is warm and dry. No rash noted.   Cardiovascular: Normal heart rate noted  Respiratory: Normal respiratory effort, no problems with respiration noted  Abdomen: Soft, gravid, appropriate for gestational age.  Pain/Pressure: Present     Pelvic: Cervical exam deferred        Extremities: Normal range of motion.     Mental Status: Normal mood and affect. Normal behavior. Normal judgment and thought content.   Assessment and Plan:  Pregnancy: G3P1011 at [redacted]w[redacted]d 1. Encounter for supervision of other normal pregnancy in third trimester Patient is doing well without complaints  2. Previous cesarean delivery affecting pregnancy Scheduled for repeat cesarean section with BTL on 7/29  3. Rubella non-immune status, antepartum Will offer pp  4. GBS bacteriuria Prophylaxis if labor  Term labor symptoms and general  obstetric precautions including but not limited to vaginal bleeding, contractions, leaking of fluid and fetal movement were reviewed in detail with the patient. Please refer to After Visit Summary for other counseling recommendations.   Return in about 6 weeks (around 06/23/2022) for postpartum.  Future Appointments  Date Time Provider Department Center  05/13/2022  9:00 AM [facility] 1 [facility] None    Catalina Antigua, MD

## 2022-05-13 ENCOUNTER — Encounter (HOSPITAL_COMMUNITY)
Admission: RE | Admit: 2022-05-13 | Discharge: 2022-05-13 | Disposition: A | Discharge status: Home / Self Care | Payer: Medicaid Other | Source: Ambulatory Visit | Attending: Obstetrics and Gynecology | Admitting: Obstetrics and Gynecology

## 2022-05-13 DIAGNOSIS — Z01812 Encounter for preprocedural laboratory examination: Secondary | ICD-10-CM | POA: Insufficient documentation

## 2022-05-13 DIAGNOSIS — O34219 Maternal care for unspecified type scar from previous cesarean delivery: Secondary | ICD-10-CM | POA: Insufficient documentation

## 2022-05-13 LAB — CBC
HCT: 34.5 % — ABNORMAL LOW (ref 36.0–46.0)
Hemoglobin: 10.9 g/dL — ABNORMAL LOW (ref 12.0–15.0)
MCH: 26.5 pg (ref 26.0–34.0)
MCHC: 31.6 g/dL (ref 30.0–36.0)
MCV: 83.9 fL (ref 80.0–100.0)
Platelets: 180 10*3/uL (ref 150–400)
RBC: 4.11 MIL/uL (ref 3.87–5.11)
RDW: 13.3 % (ref 11.5–15.5)
WBC: 6.5 10*3/uL (ref 4.0–10.5)
nRBC: 0 % (ref 0.0–0.2)

## 2022-05-13 LAB — TYPE AND SCREEN
ABO/RH(D): O POS
Antibody Screen: NEGATIVE

## 2022-05-14 LAB — RPR: RPR Ser Ql: NONREACTIVE

## 2022-05-15 ENCOUNTER — Other Ambulatory Visit: Payer: Self-pay

## 2022-05-15 ENCOUNTER — Encounter (HOSPITAL_COMMUNITY)
Admission: RE | Disposition: A | Discharge status: Home / Self Care | Payer: Self-pay | Source: Ambulatory Visit | Attending: Obstetrics and Gynecology

## 2022-05-15 ENCOUNTER — Encounter (HOSPITAL_COMMUNITY): Payer: Self-pay | Admitting: Obstetrics and Gynecology

## 2022-05-15 ENCOUNTER — Inpatient Hospital Stay (HOSPITAL_COMMUNITY): Payer: Medicaid Other | Admitting: Anesthesiology

## 2022-05-15 ENCOUNTER — Inpatient Hospital Stay (HOSPITAL_COMMUNITY)
Admission: RE | Admit: 2022-05-15 | Discharge: 2022-05-17 | DRG: 788 | Disposition: A | Discharge status: Home / Self Care | Payer: Medicaid Other | Source: Ambulatory Visit | Attending: Obstetrics and Gynecology | Admitting: Obstetrics and Gynecology

## 2022-05-15 DIAGNOSIS — Z302 Encounter for sterilization: Secondary | ICD-10-CM | POA: Diagnosis not present

## 2022-05-15 DIAGNOSIS — O99824 Streptococcus B carrier state complicating childbirth: Secondary | ICD-10-CM | POA: Diagnosis not present

## 2022-05-15 DIAGNOSIS — Z3A39 39 weeks gestation of pregnancy: Secondary | ICD-10-CM

## 2022-05-15 DIAGNOSIS — Z2839 Other underimmunization status: Secondary | ICD-10-CM

## 2022-05-15 DIAGNOSIS — Z880 Allergy status to penicillin: Secondary | ICD-10-CM | POA: Diagnosis not present

## 2022-05-15 DIAGNOSIS — O34211 Maternal care for low transverse scar from previous cesarean delivery: Secondary | ICD-10-CM

## 2022-05-15 DIAGNOSIS — Z98891 History of uterine scar from previous surgery: Secondary | ICD-10-CM

## 2022-05-15 DIAGNOSIS — Z87891 Personal history of nicotine dependence: Secondary | ICD-10-CM

## 2022-05-15 DIAGNOSIS — O0990 Supervision of high risk pregnancy, unspecified, unspecified trimester: Secondary | ICD-10-CM

## 2022-05-15 DIAGNOSIS — Z3009 Encounter for other general counseling and advice on contraception: Secondary | ICD-10-CM | POA: Diagnosis present

## 2022-05-15 DIAGNOSIS — Z30430 Encounter for insertion of intrauterine contraceptive device: Secondary | ICD-10-CM

## 2022-05-15 DIAGNOSIS — O34219 Maternal care for unspecified type scar from previous cesarean delivery: Principal | ICD-10-CM

## 2022-05-15 DIAGNOSIS — O9902 Anemia complicating childbirth: Secondary | ICD-10-CM | POA: Diagnosis present

## 2022-05-15 DIAGNOSIS — Z3490 Encounter for supervision of normal pregnancy, unspecified, unspecified trimester: Secondary | ICD-10-CM

## 2022-05-15 DIAGNOSIS — R8271 Bacteriuria: Secondary | ICD-10-CM | POA: Diagnosis present

## 2022-05-15 DIAGNOSIS — O09899 Supervision of other high risk pregnancies, unspecified trimester: Secondary | ICD-10-CM

## 2022-05-15 DIAGNOSIS — Z975 Presence of (intrauterine) contraceptive device: Secondary | ICD-10-CM

## 2022-05-15 HISTORY — PX: INTRAUTERINE DEVICE (IUD) INSERTION: SHX5877

## 2022-05-15 SURGERY — Surgical Case
Anesthesia: Spinal

## 2022-05-15 MED ORDER — PHENYLEPHRINE HCL-NACL 20-0.9 MG/250ML-% IV SOLN
INTRAVENOUS | Status: DC | PRN
  Administered 2022-05-15: 60 ug/min via INTRAVENOUS

## 2022-05-15 MED ORDER — KETOROLAC TROMETHAMINE 30 MG/ML IJ SOLN
INTRAMUSCULAR | Status: AC
  Filled 2022-05-15: qty 1

## 2022-05-15 MED ORDER — PHENYLEPHRINE HCL-NACL 20-0.9 MG/250ML-% IV SOLN
INTRAVENOUS | Status: AC
  Filled 2022-05-15: qty 250

## 2022-05-15 MED ORDER — SIMETHICONE 80 MG PO CHEW
80.0000 mg | CHEWABLE_TABLET | Freq: Three times a day (TID) | ORAL | Status: DC
Start: 2022-05-15 — End: 2022-05-17
  Administered 2022-05-15 – 2022-05-17 (×5): 80 mg via ORAL
  Filled 2022-05-15 (×5): qty 1

## 2022-05-15 MED ORDER — FENTANYL CITRATE (PF) 100 MCG/2ML IJ SOLN
INTRAMUSCULAR | Status: AC
  Filled 2022-05-15: qty 2

## 2022-05-15 MED ORDER — EPHEDRINE 5 MG/ML INJ
INTRAVENOUS | Status: AC
  Filled 2022-05-15: qty 10

## 2022-05-15 MED ORDER — OXYCODONE HCL 5 MG PO TABS
5.0000 mg | ORAL_TABLET | ORAL | Status: DC | PRN
  Administered 2022-05-15: 5 mg via ORAL
  Administered 2022-05-15 – 2022-05-16 (×5): 10 mg via ORAL
  Administered 2022-05-17 (×2): 5 mg via ORAL
  Administered 2022-05-17: 10 mg via ORAL
  Filled 2022-05-15 (×4): qty 2
  Filled 2022-05-15 (×2): qty 1
  Filled 2022-05-15 (×2): qty 2
  Filled 2022-05-15: qty 1

## 2022-05-15 MED ORDER — MORPHINE SULFATE (PF) 0.5 MG/ML IJ SOLN
INTRAMUSCULAR | Status: AC
  Filled 2022-05-15: qty 10

## 2022-05-15 MED ORDER — FENTANYL CITRATE (PF) 100 MCG/2ML IJ SOLN
INTRAMUSCULAR | Status: DC | PRN
  Administered 2022-05-15: 15 ug via INTRAVENOUS

## 2022-05-15 MED ORDER — DIPHENHYDRAMINE HCL 25 MG PO CAPS: 25.0000 mg | ORAL_CAPSULE | Freq: Four times a day (QID) | ORAL | Status: DC | PRN

## 2022-05-15 MED ORDER — EPHEDRINE SULFATE (PRESSORS) 50 MG/ML IJ SOLN
INTRAMUSCULAR | Status: DC | PRN
  Administered 2022-05-15: 10 mg via INTRAVENOUS
  Administered 2022-05-15: 40 mg via INTRAVENOUS
  Administered 2022-05-15: 10 mg via INTRAVENOUS
  Administered 2022-05-15: 30 mg via INTRAVENOUS

## 2022-05-15 MED ORDER — DIBUCAINE (PERIANAL) 1 % EX OINT: 1.0000 | TOPICAL_OINTMENT | CUTANEOUS | Status: DC | PRN

## 2022-05-15 MED ORDER — MORPHINE SULFATE (PF) 0.5 MG/ML IJ SOLN
INTRAMUSCULAR | Status: DC | PRN
  Administered 2022-05-15: 150 mg via EPIDURAL

## 2022-05-15 MED ORDER — CEFAZOLIN SODIUM-DEXTROSE 2-4 GM/100ML-% IV SOLN
2.0000 g | INTRAVENOUS | Status: AC
  Administered 2022-05-15: 2 g via INTRAVENOUS

## 2022-05-15 MED ORDER — ONDANSETRON HCL 4 MG/2ML IJ SOLN
INTRAMUSCULAR | Status: DC | PRN
  Administered 2022-05-15: 4 mg via INTRAVENOUS

## 2022-05-15 MED ORDER — TETANUS-DIPHTH-ACELL PERTUSSIS 5-2.5-18.5 LF-MCG/0.5 IM SUSY: 0.5000 mL | PREFILLED_SYRINGE | Freq: Once | INTRAMUSCULAR | Status: DC

## 2022-05-15 MED ORDER — OXYTOCIN-SODIUM CHLORIDE 30-0.9 UT/500ML-% IV SOLN
INTRAVENOUS | Status: AC
  Filled 2022-05-15: qty 500

## 2022-05-15 MED ORDER — SOD CITRATE-CITRIC ACID 500-334 MG/5ML PO SOLN
ORAL | Status: AC
  Filled 2022-05-15: qty 30

## 2022-05-15 MED ORDER — SENNOSIDES-DOCUSATE SODIUM 8.6-50 MG PO TABS
2.0000 | ORAL_TABLET | Freq: Every day | ORAL | Status: DC
Start: 2022-05-16 — End: 2022-05-17
  Administered 2022-05-16 – 2022-05-17 (×2): 2 via ORAL
  Filled 2022-05-15 (×2): qty 2

## 2022-05-15 MED ORDER — LEVONORGESTREL 20 MCG/DAY IU IUD
1.0000 | INTRAUTERINE_SYSTEM | Freq: Once | INTRAUTERINE | Status: AC
  Administered 2022-05-15: 1 via INTRAUTERINE

## 2022-05-15 MED ORDER — MENTHOL 3 MG MT LOZG: 1.0000 | LOZENGE | OROMUCOSAL | Status: DC | PRN

## 2022-05-15 MED ORDER — CEFAZOLIN SODIUM-DEXTROSE 2-4 GM/100ML-% IV SOLN
INTRAVENOUS | Status: AC
  Filled 2022-05-15: qty 100

## 2022-05-15 MED ORDER — IBUPROFEN 600 MG PO TABS
600.0000 mg | ORAL_TABLET | Freq: Four times a day (QID) | ORAL | Status: DC
  Administered 2022-05-16 – 2022-05-17 (×4): 600 mg via ORAL
  Filled 2022-05-15 (×5): qty 1

## 2022-05-15 MED ORDER — LACTATED RINGERS IV SOLN: INTRAVENOUS | Status: DC | PRN

## 2022-05-15 MED ORDER — BUPIVACAINE IN DEXTROSE 0.75-8.25 % IT SOLN
INTRATHECAL | Status: DC | PRN
  Administered 2022-05-15: 1.6 mL via INTRATHECAL

## 2022-05-15 MED ORDER — COCONUT OIL OIL: 1.0000 | TOPICAL_OIL | Status: DC | PRN

## 2022-05-15 MED ORDER — LEVONORGESTREL 20 MCG/DAY IU IUD
INTRAUTERINE_SYSTEM | INTRAUTERINE | Status: AC
  Filled 2022-05-15: qty 1

## 2022-05-15 MED ORDER — ZOLPIDEM TARTRATE 5 MG PO TABS: 5.0000 mg | ORAL_TABLET | Freq: Every evening | ORAL | Status: DC | PRN

## 2022-05-15 MED ORDER — ACETAMINOPHEN 500 MG PO TABS
ORAL_TABLET | ORAL | Status: AC
  Filled 2022-05-15: qty 2

## 2022-05-15 MED ORDER — PRENATAL MULTIVITAMIN CH
1.0000 | ORAL_TABLET | Freq: Every day | ORAL | Status: DC
  Administered 2022-05-16 – 2022-05-17 (×2): 1 via ORAL
  Filled 2022-05-15 (×2): qty 1

## 2022-05-15 MED ORDER — FENTANYL CITRATE (PF) 100 MCG/2ML IJ SOLN: 25.0000 ug | INTRAMUSCULAR | Status: DC | PRN

## 2022-05-15 MED ORDER — SIMETHICONE 80 MG PO CHEW: 80.0000 mg | CHEWABLE_TABLET | ORAL | Status: DC | PRN

## 2022-05-15 MED ORDER — KETOROLAC TROMETHAMINE 30 MG/ML IJ SOLN
30.0000 mg | Freq: Four times a day (QID) | INTRAMUSCULAR | Status: AC
  Administered 2022-05-15 – 2022-05-16 (×3): 30 mg via INTRAVENOUS
  Filled 2022-05-15 (×4): qty 1

## 2022-05-15 MED ORDER — ENOXAPARIN SODIUM 40 MG/0.4ML IJ SOSY
40.0000 mg | PREFILLED_SYRINGE | INTRAMUSCULAR | Status: DC
Start: 2022-05-16 — End: 2022-05-17
  Administered 2022-05-16 – 2022-05-17 (×2): 40 mg via SUBCUTANEOUS
  Filled 2022-05-15 (×2): qty 0.4

## 2022-05-15 MED ORDER — DEXAMETHASONE SODIUM PHOSPHATE 10 MG/ML IJ SOLN
INTRAMUSCULAR | Status: DC | PRN
  Administered 2022-05-15: 10 mg via INTRAVENOUS

## 2022-05-15 MED ORDER — LACTATED RINGERS IV SOLN: INTRAVENOUS | Status: DC

## 2022-05-15 MED ORDER — KETOROLAC TROMETHAMINE 15 MG/ML IJ SOLN
15.0000 mg | INTRAMUSCULAR | Status: AC
  Administered 2022-05-15: 15 mg via INTRAVENOUS
  Filled 2022-05-15: qty 1

## 2022-05-15 MED ORDER — OXYTOCIN-SODIUM CHLORIDE 30-0.9 UT/500ML-% IV SOLN
2.5000 [IU]/h | INTRAVENOUS | Status: AC
  Administered 2022-05-15: 2.5 [IU]/h via INTRAVENOUS
  Filled 2022-05-15: qty 500

## 2022-05-15 MED ORDER — WITCH HAZEL-GLYCERIN EX PADS: 1.0000 | MEDICATED_PAD | CUTANEOUS | Status: DC | PRN

## 2022-05-15 MED ORDER — SOD CITRATE-CITRIC ACID 500-334 MG/5ML PO SOLN
30.0000 mL | ORAL | Status: AC
  Administered 2022-05-15: 30 mL via ORAL

## 2022-05-15 MED ORDER — ACETAMINOPHEN 500 MG PO TABS
1000.0000 mg | ORAL_TABLET | Freq: Four times a day (QID) | ORAL | Status: DC
Start: 2022-05-15 — End: 2022-05-17
  Administered 2022-05-15 – 2022-05-17 (×8): 1000 mg via ORAL
  Filled 2022-05-15 (×9): qty 2

## 2022-05-15 MED ORDER — POVIDONE-IODINE 10 % EX SWAB
2.0000 | Freq: Once | CUTANEOUS | Status: AC
  Administered 2022-05-15: 2 via TOPICAL

## 2022-05-15 MED ORDER — ACETAMINOPHEN 500 MG PO TABS
1000.0000 mg | ORAL_TABLET | ORAL | Status: AC
  Administered 2022-05-15: 1000 mg via ORAL

## 2022-05-15 SURGICAL SUPPLY — 38 items
BENZOIN TINCTURE PRP APPL 2/3 (GAUZE/BANDAGES/DRESSINGS) ×3 IMPLANT
CHLORAPREP W/TINT 26 (MISCELLANEOUS) ×6 IMPLANT
CLAMP CORD UMBIL (MISCELLANEOUS) ×3 IMPLANT
CLOSURE STERI STRIP 1/2 X4 (GAUZE/BANDAGES/DRESSINGS) ×1 IMPLANT
CLOTH BEACON ORANGE TIMEOUT ST (SAFETY) ×3 IMPLANT
DRAPE C SECTION CLR SCREEN (DRAPES) IMPLANT
DRSG OPSITE POSTOP 4X10 (GAUZE/BANDAGES/DRESSINGS) ×3 IMPLANT
ELECT REM PT RETURN 9FT ADLT (ELECTROSURGICAL) ×3
ELECTRODE REM PT RTRN 9FT ADLT (ELECTROSURGICAL) ×2 IMPLANT
EXTRACTOR VACUUM M CUP 4 TUBE (SUCTIONS) IMPLANT
GLOVE BIO SURGEON STRL SZ7.5 (GLOVE) ×3 IMPLANT
GLOVE BIOGEL PI IND STRL 7.0 (GLOVE) ×4 IMPLANT
GLOVE BIOGEL PI INDICATOR 7.0 (GLOVE) ×2
GOWN STRL REUS W/TWL 2XL LVL3 (GOWN DISPOSABLE) ×3 IMPLANT
GOWN STRL REUS W/TWL LRG LVL3 (GOWN DISPOSABLE) ×6 IMPLANT
KIT ABG SYR 3ML LUER SLIP (SYRINGE) IMPLANT
NDL HYPO 25X5/8 SAFETYGLIDE (NEEDLE) IMPLANT
NEEDLE HYPO 22GX1.5 SAFETY (NEEDLE) ×3 IMPLANT
NEEDLE HYPO 25X5/8 SAFETYGLIDE (NEEDLE) IMPLANT
NS IRRIG 1000ML POUR BTL (IV SOLUTION) ×3 IMPLANT
PACK C SECTION WH (CUSTOM PROCEDURE TRAY) ×3 IMPLANT
PAD ABD DERMACEA PRESS 5X9 (GAUZE/BANDAGES/DRESSINGS) ×1 IMPLANT
PAD OB MATERNITY 4.3X12.25 (PERSONAL CARE ITEMS) ×3 IMPLANT
RTRCTR C-SECT PINK 25CM LRG (MISCELLANEOUS) ×3 IMPLANT
STRIP CLOSURE SKIN 1/2X4 (GAUZE/BANDAGES/DRESSINGS) ×3 IMPLANT
SUT CHROMIC 1 CTX 36 (SUTURE) ×6 IMPLANT
SUT PLAIN 0 NONE (SUTURE) ×3 IMPLANT
SUT VIC AB 1 CT1 36 (SUTURE) ×3 IMPLANT
SUT VIC AB 2-0 CT1 (SUTURE) ×3 IMPLANT
SUT VIC AB 3-0 CT1 27 (SUTURE) ×1
SUT VIC AB 3-0 CT1 TAPERPNT 27 (SUTURE) ×2 IMPLANT
SUT VIC AB 3-0 SH 27 (SUTURE)
SUT VIC AB 3-0 SH 27X BRD (SUTURE) IMPLANT
SUT VIC AB 4-0 KS 27 (SUTURE) ×3 IMPLANT
SYR BULB IRRIGATION 50ML (SYRINGE) IMPLANT
TOWEL OR 17X24 6PK STRL BLUE (TOWEL DISPOSABLE) ×3 IMPLANT
TRAY FOLEY W/BAG SLVR 14FR LF (SET/KITS/TRAYS/PACK) ×3 IMPLANT
WATER STERILE IRR 1000ML POUR (IV SOLUTION) ×3 IMPLANT

## 2022-05-15 NOTE — H&P (Signed)
OBSTETRIC ADMISSION HISTORY AND PHYSICAL  Maria Meyer is a 31 y.o. female G66P1011 with IUP at 47w0dby early [place] for scheduled repeat CS. She reports +FMs, No LOF, no VB, no blurry vision, headaches or peripheral edema, and RUQ pain.  She plans on breast feeding. She was previously planning BTL for birth control but now thinking she wants an IUD [facility] [place]  Estimated Date of Delivery: 05/22/22  Sono:   _0 , CWD, normal anatomy, breech presentation, posterior placental lie, 338g, 32% EFW   Prenatal History/Complications:  Prior CS x1 GBS in urine Rubella non-immune   Past Medical History: Past Medical History:  Diagnosis Date   Anxiety    Headache    tension    Past Surgical History: Past Surgical History:  Procedure Laterality Date   CESAREAN SECTION  01/04/2013   DILATION AND EVACUATION N/A 07/15/2021   Procedure: DILATATION AND EVACUATION;  Surgeon: AOsborne Oman MD;  [facility]  Service: Gynecology;  Laterality: N/A;    Obstetrical History: OB History     Gravida  3   Para  1   Term  1   Preterm      AB  1   Living  1      SAB  1   IAB      Ectopic      Multiple      Live Births  1           Social History Social History   Socioeconomic History   Marital status: Single    Spouse name: Not on file   Number of children: Not on file   Years of education: Not on file   Highest education level: Not on file  Occupational History   Not on file  Tobacco Use   Smoking status: Former    Types: Cigarettes    Quit date: 04/2020    Years since quitting: 2.0   Smokeless tobacco: Never   Tobacco comments:    Social smoking only  Vaping Use   Vaping Use: Never used  Substance and Sexual Activity   Alcohol use: Not Currently    Comment: Not since confirmed pregnancy   Drug use: Not Currently    Types: Marijuana    Comment: last used early June  2022   Sexual activity: Yes    Partners: Male    Birth control/protection: None  Other Topics Concern   Not on file  Social History Narrative   Not on file   Social Determinants of Health   Financial Resource Strain: Not on file  Food Insecurity: Not on file  Transportation Needs: Not on file  Physical Activity: Not on file  Stress: Not on file  Social Connections: Not on file    Family History: Family History  Problem Relation Age of Onset   Vasculitis Father    Cancer Paternal Grandmother     Allergies: Allergies  Allergen Reactions   Penicillins Hives    6 + years ago    Medications Prior to Admission  Medication Sig Dispense Refill Last Dose   acetaminophen (TYLENOL) 500 MG tablet Take 1,000 mg by mouth every 6 (six) hours as needed for moderate pain.      pantoprazole (PROTONIX) 20 MG tablet Take 1 tablet (20 mg total) by mouth 2 (two) times daily. (Patient not taking: Reported on 05/12/2022) 60 tablet 0  Prenatal MV & Min w/FA-DHA (PRENATAL GUMMIES PO) Take 2 capsules by mouth daily.      Elastic Bandages & Supports (ABDOMINAL BINDER/ELASTIC XL) MISC 1 Units by Does not apply route daily. Maternity belt (Patient not taking: Reported on 05/12/2022) 1 each 0      Review of Systems   All systems reviewed and negative except as stated in HPI  Blood pressure 121/74, pulse 72, temperature 98.2 F (36.8 C), temperature source Oral, resp. rate 16, height 5' 4.5" (1.638 m), weight 81.9 kg, last menstrual period 08/15/2021, unknown if currently breastfeeding. General appearance: alert Lungs: clear to auscultation bilaterally Heart: regular rate and rhythm Abdomen: soft, non-tender; bowel sounds normal Extremities: Homans sign is negative, no sign of DVT    Prenatal labs: ABO, Rh: --/--/O POS (07/27 1415) Antibody: NEG (07/27 1415) Rubella: <0.90 (02/06 1143) RPR: NON REACTIVE (07/27 1430)  HBsAg: Negative (02/06 1143)  HIV: Non Reactive (05/25 1003)  GBS:    + in urine 2 hr Glucola normal Genetic screening AFP negative, LR NIPS, negative carrier screen Anatomy US normal  Prenatal Transfer Tool  Maternal Diabetes: No Genetic Screening: Normal Maternal Ultrasounds/Referrals: Normal Fetal Ultrasounds or other Referrals:  None Maternal Substance Abuse:  No Significant Maternal Medications:  None Significant Maternal Lab Results: Other:  GBS in urine  No results found for this or any previous visit (from the past 24 hour(s)).  Patient Active Problem List   Diagnosis Date Noted   Unwanted fertility 03/17/2022   Gastroesophageal reflux during pregnancy in second trimester, antepartum 02/18/2022   GBS bacteriuria 11/30/2021   Rubella non-immune status, antepartum 11/24/2021   Previous cesarean delivery affecting pregnancy 11/23/2021   Supervision of normal pregnancy 10/30/2021    Assessment/Plan:  Maria Meyer is a 31 y.o. G3P1011 at 50w0dhere for schedule repeat CS  #Repeat CS The risks of cesarean section were discussed with the patient including but were not limited to: bleeding which may require transfusion or reoperation; infection which may require antibiotics; injury to bowel, bladder, ureters or other surrounding organs; injury to the fetus; need for additional procedures including hysterectomy in the event of a life-threatening hemorrhage; placental abnormalities wth subsequent pregnancies, incisional problems, thromboembolic phenomenon and other postoperative/anesthesia complications.   Patient has been NPO since 10PM she will remain NPO for procedure. Anesthesia and OR aware.  Preoperative prophylactic antibiotics and SCDs ordered on call to the OR.  To OR when ready.  #Pain: Spinal #ID:  GBS urine - plan for pre-op abx ppx #MOF: breast #MOC:considering mirena IUD #Circ:  Yes  #Rubella non-immune MMR pp  ARenard Matter MD  05/15/2022, 8:32 AM

## 2022-05-15 NOTE — Anesthesia Preprocedure Evaluation (Addendum)
Anesthesia Evaluation  Patient identified by MRN, date of birth, ID band Patient awake    Reviewed: Allergy & Precautions, NPO status , Patient's Chart, lab work & pertinent test results  History of Anesthesia Complications Negative for: history of anesthetic complications  Airway Mallampati: III  TM Distance: >3 FB Neck ROM: Full    Dental  (+) Teeth Intact, Dental Advisory Given,    Pulmonary neg pulmonary ROS, former smoker,    breath sounds clear to auscultation       Cardiovascular negative cardio ROS   Rhythm:Regular     Neuro/Psych  Headaches, Anxiety    GI/Hepatic negative GI ROS, Neg liver ROS, GERD  ,  Endo/Other  negative endocrine ROS  Renal/GU negative Renal ROS     Musculoskeletal negative musculoskeletal ROS (+)   Abdominal   Peds  Hematology  (+) Blood dyscrasia, anemia , Lab Results      Component                Value               Date                      WBC                      6.5                 05/13/2022                HGB                      10.9 (L)            05/13/2022                HCT                      34.5 (L)            05/13/2022                MCV                      83.9                05/13/2022                PLT                      180                 05/13/2022              Anesthesia Other Findings   Reproductive/Obstetrics (+) Pregnancy                            Anesthesia Physical Anesthesia Plan  ASA: 2  Anesthesia Plan: Spinal   Post-op Pain Management: Tylenol PO (pre-op)* and Toradol IV (intra-op)*   Induction:   PONV Risk Score and Plan: 2 and Ondansetron and Treatment may vary due to age or medical condition  Airway Management Planned: Natural Airway  Additional Equipment: None  Intra-op Plan:   Post-operative Plan:   Informed Consent: I have reviewed the patients History and Physical, chart, labs and discussed the  procedure including the risks, benefits and alternatives for the  proposed anesthesia with the patient or authorized representative who has indicated his/her understanding and acceptance.     Dental advisory given  Plan Discussed with: CRNA  Anesthesia Plan Comments: (toradol given preop)        Anesthesia Quick Evaluation

## 2022-05-15 NOTE — Progress Notes (Signed)
May give 1 dose of oxycodone for pain

## 2022-05-15 NOTE — Anesthesia Procedure Notes (Signed)
Spinal  Patient location during procedure: OR Start time: 05/15/2022 10:03 AM End time: 05/15/2022 10:07 AM Reason for block: surgical anesthesia Staffing Performed: anesthesiologist  Anesthesiologist: Val Eagle, MD Performed by: Val Eagle, MD Authorized by: Val Eagle, MD   Preanesthetic Checklist Completed: patient identified, IV checked, risks and benefits discussed, surgical consent, monitors and equipment checked, pre-op evaluation and timeout performed Spinal Block Patient position: sitting Prep: DuraPrep Patient monitoring: heart rate, cardiac monitor, continuous pulse ox and blood pressure Approach: midline Location: L4-5 Injection technique: single-shot Needle Needle type: Sprotte  Needle gauge: 24 G Needle length: 9 cm Assessment Sensory level: T4 Events: CSF return

## 2022-05-15 NOTE — Lactation Note (Signed)
This note was copied from a baby's chart. Lactation Consultation Note  Patient Name: Maria Meyer [id number] Date: 05/15/2022 Reason for consult: Initial assessment;Mother's request;Term;Breastfeeding assistance Age:32 hours  LC assisted with latch with signs of milk transfer. Infant still feeding at the end of the visit.   Plan 1. To feed based on cues 8-12x 24hr period. Birth parent to offer breasts and look for signs of milk transfer.  2. If unable to latch, offer colostrum on spoon then try a latch.  All questions answered at the end of the visit.   Maternal Data Has patient been taught Hand Expression?: Yes Does the patient have breastfeeding experience prior to this delivery?: Yes How long did the patient breastfeed?: 11 months son currenlty 1 years old  Feeding Mother's Current Feeding Choice: Breast Milk  LATCH Score Latch: Repeated attempts needed to sustain latch, nipple held in mouth throughout feeding, stimulation needed to elicit sucking reflex.  Audible Swallowing: Spontaneous and intermittent  Type of Nipple: Everted at rest and after stimulation  Comfort (Breast/Nipple): Soft / non-tender  Hold (Positioning): No assistance needed to correctly position infant at breast.  LATCH Score: 9   Lactation Tools Discussed/Used    Interventions Interventions: Breast feeding basics reviewed;Assisted with latch;Skin to skin;Breast massage;Hand express;Breast compression;Adjust position;Support pillows;Position options;Expressed milk;Education;Infant Driven Feeding Algorithm education;LC Services brochure  Discharge Pump: Personal Chiropractor) WIC Program: No  Consult Status Consult Status: Follow-up Date: 05/16/22 Follow-up type: In-patient    Maria Meyer  Maria Meyer 05/15/2022, 4:59 PM

## 2022-05-15 NOTE — Op Note (Signed)
Cesarean Section Procedure Note   05/15/2022   11:41 AM   PATIENT:  Maria Meyer  31 y.o. female   PRE-OPERATIVE DIAGNOSIS:  RCS Undesired Fertility   POST-OPERATIVE DIAGNOSIS:  RCS with BTL   PROCEDURE:  Procedure(s): CESAREAN SECTION (N/A) INTRAUTERINE DEVICE (IUD) INSERTION   SURGEON:  Surgeon(s) and Role:    * Ervin, Marolyn Hammock, MD - Primary    * Warner Mccreedy, MD - Assisting   ASSISTANTS: Sheppard Evens, MD   ANESTHESIA:   spinal   EBL:  Total I/O In: 1600 [I.V.:1500; IV Piggyback:100] Out: 860 [Urine:30; Blood:830]   BLOOD ADMINISTERED:none   DRAINS: Urinary Catheter (Foley)    LOCAL MEDICATIONS USED:  NONE   SPECIMEN:  placenta   DISPOSITION OF SPECIMEN: to postpartum     Procedure Details    The patient was seen in the Holding Room. The risks, benefits, complications, treatment options, and expected outcomes were discussed with the patient.  The patient concurred with the proposed plan, giving informed consent.  The site of surgery properly noted/marked. The patient was taken to Operating Room # 1, identified as Pema Thomure and the procedure verified as C-Section Delivery. A Time Out was held and the above information confirmed.   After induction of anesthesia, the patient was draped and prepped in the usual sterile manner. A Pfannenstiel incision was made and carried down through the subcutaneous tissue to the fascia. Fascial incision was made and extended transversely. The fascia was separated from the underlying rectus tissue superiorly and inferiorly. The peritoneum was identified and entered. Peritoneal incision was extended longitudinally. The utero-vesical peritoneal reflection was incised transversely and the bladder flap was bluntly freed from the lower uterine segment. A low transverse uterine incision was made. Delivered from cephalic presentation was a 3890 gram Female with Apgar scores of 7 at one minute and 9 at five minutes. After the umbilical cord  was clamped and cut cord blood was obtained for evaluation. The placenta was removed intact and appeared normal. The uterine outline, tubes and ovaries appeared normal. The uterine incision was closed with running locked sutures of Chromic in one layer. Hemostasis was observed. The peritoneum was closed using 2-0 Vicryl in a purse string fashion, after which the rectus muscles were reapproximated using simple interrupted sutures of 2-0 Vicryl.  The fascia was then reapproximated with running sutures of 0 Vicryl.  The subcutaneous tissue was reapproximated with running sutures of plain catgut and the skin was reapproximated with 4-0 Monocryl.   Instrument, sponge, and needle counts were correct prior the abdominal closure and at the conclusion of the case.    Complications:  None; patient tolerated the procedure well.   COUNTS:  YES   PLAN OF CARE: Admit to inpatient  - postpartum   PATIENT DISPOSITION:  PACU - hemodynamically stable.   Delay start of Pharmacological VTE agent (>24hrs) due to surgical blood loss or risk of bleeding: yes             Disposition: PACU - hemodynamically stable.         Condition: stable     Fadia Marlar J, MD 05/15/2022 11:41 AM

## 2022-05-15 NOTE — Discharge Summary (Signed)
   Postpartum Discharge Summary  Date of Service updated***     Patient Name: Maria Meyer DOB: 08/17/1991 MRN: [id number]  Date of admission: 05/15/2022 Delivery date:05/15/2022  Delivering provider: ERVIN, MICHAEL L  Date of discharge: 05/15/2022  Admitting diagnosis: RCS Undesired Fertility Intrauterine pregnancy: [redacted]w[redacted]d     Secondary diagnosis:  Active Problems:   Supervision of normal pregnancy   Previous cesarean delivery affecting pregnancy   Rubella non-immune status, antepartum   GBS bacteriuria   Unwanted fertility   Status post repeat low transverse cesarean section   IUD (intrauterine device) in place  Additional problems: none***    Discharge diagnosis: Term Pregnancy Delivered                                              Post partum procedures: Post placental IUD Augmentation: N/A Complications: None  Hospital course: Sceduled C/S   30 y.o. yo G3P2012 at [redacted]w[redacted]d was admitted to the hospital 05/15/2022 for scheduled cesarean section with the following indication:Elective Repeat.Delivery details are as follows:  Membrane Rupture Time/Date: 10:44 AM ,05/15/2022   Delivery Method:C-Section, Low Transverse  Details of operation can be found in separate operative note.  Patient had an uncomplicated postpartum course.  She is ambulating, tolerating a regular diet, passing flatus, and urinating well. Patient is discharged home in stable condition on  05/15/22        Newborn Data: Birth date:05/15/2022  Birth time:10:45 AM  Gender:Female  Living status:Living  Apgars:7 ,9  Weight:3890 g     Magnesium Sulfate received: No BMZ received: No Rhophylac:N/A MMR:{MMR:30440033} T-DaP:Given prenatally Flu: N/A Transfusion:{Transfusion received:30440034}  Physical exam  Vitals:   05/15/22 1238 05/15/22 1303 05/15/22 1304 05/15/22 1305  BP:      Pulse: 73 68 (!) 58 (!) 59  Resp: 13 15 12 13  Temp:      TempSrc:      SpO2: 97% 98% 97% 98%  Weight:      Height:        General: {Exam; general:21111117} Lochia: {Desc; appropriate/inappropriate:30686::"appropriate"} Uterine Fundus: {Desc; firm/soft:30687} Incision: {Exam; incision:21111123} DVT Evaluation: {Exam; dvt:2111122} Labs: Lab Results  Component Value Date   WBC 6.5 05/13/2022   HGB 10.9 (L) 05/13/2022   HCT 34.5 (L) 05/13/2022   MCV 83.9 05/13/2022   PLT 180 05/13/2022       No data to display         Edinburgh Score:     No data to display           After visit meds:  Allergies as of 05/15/2022       Reactions   Penicillins Hives   6 + years ago     Med Rec must be completed prior to using this SMARTLINK***        Discharge home in stable condition Infant Feeding: Breast Infant Disposition:{CHL IP OB HOME WITH MOTHER:23581} Discharge instruction: per After Visit Summary and Postpartum booklet. Activity: Advance as tolerated. Pelvic rest for 6 weeks.  Diet: routine diet Future Appointments: Future Appointments  Date Time Provider Department Center  06/29/2022 10:55 AM Leftwich-Kirby, Lisa A, CNM [facility] None   Follow up Visit: Message sent to [facility] on 05/15/2022 by Dr Ndulue.  Please schedule this patient for a In person postpartum visit in 6 weeks with the following provider: Any provider. Additional Postpartum F/U:Incision check 1 week    Low risk pregnancy complicated by:  noine Delivery mode:  C-Section, Low Transverse  Anticipated Birth Control:  PP IUD placed   05/15/2022 Chiagozier J Obiajunwa, MD    

## 2022-05-15 NOTE — Transfer of Care (Signed)
Immediate Anesthesia Transfer of Care Note  Patient: Maria Meyer  Procedure(s) Performed: CESAREAN SECTION INTRAUTERINE DEVICE (IUD) INSERTION  Patient Location: PACU  Anesthesia Type:MAC  Level of Consciousness: awake, alert  and oriented  Airway & Oxygen Therapy: Patient Spontanous Breathing and Patient connected to nasal cannula oxygen  Post-op Assessment: Report given to RN and Post -op Vital signs reviewed and stable  Post vital signs: Reviewed and stable  Last Vitals:  Vitals Value Taken Time  BP 117/63 05/15/22 1630  Temp 36.6 C 05/15/22 1630  Pulse 83 05/15/22 1630  Resp 18 05/15/22 1630  SpO2 98 % 05/15/22 1630    Last Pain:  Vitals:   05/15/22 1824  TempSrc:   PainSc: 5          Complications: No notable events documented.

## 2022-05-16 ENCOUNTER — Encounter (HOSPITAL_COMMUNITY): Payer: Self-pay | Admitting: Obstetrics and Gynecology

## 2022-05-16 LAB — CBC
HCT: 26.9 % — ABNORMAL LOW (ref 36.0–46.0)
Hemoglobin: 8.6 g/dL — ABNORMAL LOW (ref 12.0–15.0)
MCH: 27 pg (ref 26.0–34.0)
MCHC: 32 g/dL (ref 30.0–36.0)
MCV: 84.6 fL (ref 80.0–100.0)
Platelets: 188 10*3/uL (ref 150–400)
RBC: 3.18 MIL/uL — ABNORMAL LOW (ref 3.87–5.11)
RDW: 13.2 % (ref 11.5–15.5)
WBC: 16.7 10*3/uL — ABNORMAL HIGH (ref 4.0–10.5)
nRBC: 0 % (ref 0.0–0.2)

## 2022-05-16 LAB — CREATININE, SERUM
Creatinine, Ser: 0.72 mg/dL (ref 0.44–1.00)
GFR, Estimated: >60 mL/min (ref >60)

## 2022-05-16 MED ORDER — FERROUS SULFATE 325 (65 FE) MG PO TABS
325.0000 mg | ORAL_TABLET | ORAL | Status: DC
  Administered 2022-05-16: 325 mg via ORAL
  Filled 2022-05-16: qty 1

## 2022-05-16 MED ORDER — MEASLES, MUMPS & RUBELLA VAC IJ SOLR: 0.5000 mL | Freq: Once | INTRAMUSCULAR | Status: DC

## 2022-05-16 NOTE — Lactation Note (Signed)
This note was copied from a baby's chart. Lactation Consultation Note  Patient Name: Maria Meyer BEEFE'O Date: 05/16/2022 Reason for consult: Follow-up assessment;Term;Infant weight loss;Breastfeeding assistance (3.21% WL) Age:32 hours  P1, Term, Infant Female, 3.21% WL  LC entered the room and baby was asleep in the bassinet. Per the birthing parent, things have been going well breastfeeding. The birthing parent states that she received assistance from the night time [facility] and has not had issues since.   The birthing parent asked about using the pacifier. LC let the parents know that using the pacifier in the early days may mask feeding cues. LC encouraged the parents to watch the feeding cues and let them know that baby may be cluster feeding soon.   The parents state that they have no further questions or concerns.   LC left her name on the board and the parents will call if they need assistance with breastfeeding.   Consult Status Consult Status: Follow-up Date: 05/17/22 Follow-up type: In-patient    [name] 05/16/2022, 5:09 PM

## 2022-05-16 NOTE — Lactation Note (Signed)
This note was copied from a baby's chart. Lactation Consultation Note  Patient Name: Maria Meyer [id number] Date: 05/16/2022 Reason for consult: Mother's request;Term Age:32 hours  Birthing parent asked for latching assistance. Baby has been very spitty and not hungry. Mom stated baby is very gassy and has pooped a lot. Abdomin appears slightly large. Baby acted as if he was going to spit up but didn't. Baby latched well. Painful latch denied. Answered questions birthing parent had. Encouraged to call for further assistance as needed.  Maternal Data    Feeding    LATCH Score Latch: Grasps breast easily, tongue down, lips flanged, rhythmical sucking.  Audible Swallowing: A few with stimulation  Type of Nipple: Everted at rest and after stimulation  Comfort (Breast/Nipple): Soft / non-tender  Hold (Positioning): Assistance needed to correctly position infant at breast and maintain latch.  LATCH Score: 8   Lactation Tools Discussed/Used    Interventions Interventions: Breast feeding basics reviewed;Assisted with latch;Skin to skin;Breast massage;Breast compression;Adjust position;Support pillows;Position options  Discharge    Consult Status Consult Status: Follow-up Date: 05/16/22 Follow-up type: In-patient    [name], Diamond Nickel 05/16/2022, 5:46 AM

## 2022-05-16 NOTE — Progress Notes (Signed)
POSTPARTUM PROGRESS NOTE  POD #1  Subjective:  Inza Mikrut is a 31 y.o. T0G2694 s/p rLTCS at [redacted]w[redacted]d.  She reports she doing well. No acute events overnight. She reports she is doing well. She denies any problems with ambulating, voiding or po intake. Denies nausea or vomiting. She has passed flatus. Pain is well controlled.  Lochia is minimal.  Objective: Blood pressure 114/64, pulse 65, temperature 98.2 F (36.8 C), temperature source Oral, resp. rate 18, height 5' 4.5" (1.638 m), weight 81.9 kg, last menstrual period 08/15/2021, SpO2 100 %, unknown if currently breastfeeding.  Physical Exam:  General: alert, cooperative and no distress Chest: no respiratory distress Heart:regular rate noted, distal pulses intact Abdomen: soft, nontender Uterine Fundus: firm, appropriately tender DVT Evaluation: No calf swelling or tenderness Extremities: No LE edema Skin: warm, dry; incision clean/dry/intact w/ pressure dressing in place  Recent Labs    05/13/22 1430 05/16/22 0424  HGB 10.9* 8.6*  HCT 34.5* 26.9*    Assessment/Plan: Antonya Leeder is a 31 y.o. W5I6270 s/p rLTCS at [redacted]w[redacted]d.  POD#1 - Doing welll; pain well controlled. H/H appropriate  Routine postpartum care  OOB, ambulated  Lovenox for VTE prophylaxis Anemia: asymptomatic  -Start po ferrous sulfate Contraception: IUD placed during C/S Feeding: Breast  Dispo: Plan for discharge 8/1.   LOS: 1 day   Lavonda Jumbo, DO OB Fellow, [facility], [facility] 05/16/2022, 6:24 AM

## 2022-05-16 NOTE — Progress Notes (Deleted)
Circumcision Consent  Discussed with mom at bedside about circumcision.   Circumcision is a surgery that removes the skin that covers the tip of the penis, called the "foreskin." Circumcision is usually done when a boy is between 1 and 10 days old, sometimes up to 3-4 weeks old.  The most common reasons boys are circumcised include for cultural/religious beliefs or for parental preference (potentially easier to clean, so baby looks like daddy, etc).  There may be some medical benefits for circumcision:   Circumcised boys seem to have slightly lower rates of: ? Urinary tract infections (per the American Academy of Pediatrics an uncircumcised boy has a 1/100 chance of developing a UTI in the first year of life, a circumcised boy at a 10/998 chance of developing a UTI in the first year of life- a 10% reduction) ? Penis cancer (typically rare- an uncircumcised female has a 1 in 100,000 chance of developing cancer of the penis) ? Sexually transmitted infection (in endemic areas, including HIV, HPV and Herpes- circumcision does NOT protect against gonorrhea, chlamydia, trachomatis, or syphilis) ? Phimosis: a condition where that makes retraction of the foreskin over the glans impossible (0.4 per 1000 boys per year or 0.6% of boys are affected by their 15th birthday)  Boys and men who are not circumcised can reduce these extra risks by: ? Cleaning their penis well ? Using condoms during sex  What are the risks of circumcision?  As with any surgical procedure, there are risks and complications. In circumcision, complications are rare and usually minor, the most common being: ? Bleeding- risk is reduced by holding each clamp for 30 seconds prior to a cut being made, and by holding pressure after the procedure is done ? Infection- the penis is cleaned prior to the procedure, and the procedure is done under sterile technique ? Damage to the urethra or amputation of the penis  How is circumcision done  in baby boys?  The baby will be placed on a special table and the legs restrained for their safety. Numbing medication is injected into the penis, and the skin is cleansed with betadine to decrease the risk of infection.   What to expect:  The penis will look red and raw for 5-7 days as it heals. We expect scabbing around where the cut was made, as well as clear-pink fluid and some swelling of the penis right after the procedure. If your baby's circumcision starts to bleed or develops pus, please contact your pediatrician immediately.  All questions were answered and mother consented.  Kaira Stringfield [facility] Fellow  

## 2022-05-16 NOTE — Progress Notes (Signed)
Mom states she is cramping/lots of pain/instr her she has to get oob and walk in halls/sit up in chair that she will feel better   Her and partner verbalized understanding

## 2022-05-17 MED ORDER — FERROUS SULFATE 325 (65 FE) MG PO TABS: 325.0000 mg | ORAL_TABLET | ORAL | 0 refills | Status: AC

## 2022-05-17 MED ORDER — IBUPROFEN 600 MG PO TABS: 600.0000 mg | ORAL_TABLET | Freq: Four times a day (QID) | ORAL | 0 refills | Status: AC

## 2022-05-17 MED ORDER — ACETAMINOPHEN 500 MG PO TABS: 1000.0000 mg | ORAL_TABLET | Freq: Four times a day (QID) | ORAL | 0 refills | Status: AC | PRN

## 2022-05-17 MED ORDER — MORPHINE SULFATE (PF) 0.5 MG/ML IJ SOLN
INTRAMUSCULAR | Status: DC | PRN
  Administered 2022-05-15: 150 ug via INTRATHECAL

## 2022-05-17 MED ORDER — OXYCODONE HCL 5 MG PO TABS: 5.0000 mg | ORAL_TABLET | ORAL | 0 refills | Status: AC | PRN

## 2022-05-17 NOTE — Lactation Note (Signed)
This note was copied from a baby's chart. Lactation Consultation Note  Patient Name: Maria Meyer GGYIR'S Date: 05/17/2022 Reason for consult: Follow-up assessment;Mother's request;Early term 37-38.6wks;Breastfeeding assistance Age:32 hours  Infant frequents voids last stool yesterday at 1043 am. Prior to that infant adequate stool output. Infant 8% weight loss.   Birth parent feeding by cues, increase frequency feeding q 3hrs if infant does not cue. Birth parent to offer colostrum on spoon if sleepy, then try a latch.   Birth parent has Spectra pump at home. We discussed post pump after each feeding for 15 mins and using pace bottle feeding with yellow slow flow nipple. BF supplementation guide provided.  Outpatient LC services to reach out to parents as well.   Birth parent to call to view a latch prior to discharge.   Maternal Data Has patient been taught Hand Expression?: Yes Does the patient have breastfeeding experience prior to this delivery?: Yes How long did the patient breastfeed?: 11 months  Feeding Mother's Current Feeding Choice: Breast Milk  LATCH Score                    Lactation Tools Discussed/Used    Interventions Interventions: Breast feeding basics reviewed;Hand express;Breast compression;Position options;Expressed milk;Education;LC Psychologist, educational;Infant Driven Feeding Algorithm education  Discharge Discharge Education: Engorgement and breast care;Warning signs for feeding baby;Outpatient recommendation;Outpatient Epic message sent Pump: Personal (Spectra pump at home) [facility] Program: No  Consult Status Consult Status: Complete Date: 05/18/22    Maria Canby  Meyer 05/17/2022, 12:57 PM

## 2022-05-17 NOTE — Anesthesia Postprocedure Evaluation (Signed)
Anesthesia Post Note  Patient: Maria Meyer  Procedure(s) Performed: CESAREAN SECTION INTRAUTERINE DEVICE (IUD) INSERTION     Patient location during evaluation: PACU Anesthesia Type: Spinal Level of consciousness: oriented and awake and alert Pain management: pain level controlled Vital Signs Assessment: post-procedure vital signs reviewed and stable Respiratory status: spontaneous breathing and respiratory function stable Cardiovascular status: blood pressure returned to baseline and stable Postop Assessment: no apparent nausea or vomiting Anesthetic complications: no   No notable events documented.  Last Vitals:  Vitals:   05/16/22 2152 05/17/22 0637  BP: 105/61 117/70  Pulse: 65 63  Resp: 20 18  Temp: 36.9 C 36.8 C  SpO2: 100%     Last Pain:  Vitals:   05/17/22 0638  TempSrc:   PainSc: 6                  Shonte Beutler

## 2022-05-17 NOTE — Progress Notes (Signed)
POSTPARTUM PROGRESS NOTE  POD #2  Subjective:  Maria Meyer is a 31 y.o. Y7C6237 s/p rLTCS at [redacted]w[redacted]d.  She reports she doing well. No acute events overnight. She reports she is doing well. She denies any problems with ambulating, voiding or po intake. Denies nausea or vomiting. She has passed flatus. Pain is moderately controlled.  Lochia is minimal.  Objective: Blood pressure 117/70, pulse 63, temperature 98.2 F (36.8 C), temperature source Oral, resp. rate 18, height 5' 4.5" (1.638 m), weight 81.9 kg, last menstrual period 08/15/2021, SpO2 100 %, unknown if currently breastfeeding.  Physical Exam:  General: alert, cooperative and no distress Chest: no respiratory distress Heart:regular rate noted, distal pulses intact Abdomen: soft, nontender Uterine Fundus: firm, appropriately tender DVT Evaluation: No calf swelling or tenderness Extremities: No LE edema Skin: warm, dry; incision clean/dry/intact w/ pressure dressing in place  Recent Labs    05/16/22 0424  HGB 8.6*  HCT 26.9*    Assessment/Plan: Maria Meyer is a 31 y.o. S2G3151 s/p rLTCS at [redacted]w[redacted]d.  POD#2 - Doing welll; pain moderately controlled. Encouraged pain medication every 6 hours PRN.   Routine postpartum care  OOB, ambulated  Lovenox for VTE prophylaxis Anemia: asymptomatic -Continue po ferrous sulfate every other day Contraception: BTL 7/29 Feeding: Breast  Dispo: Plan for discharge 8/1.   LOS: 2 days   Lavonda Jumbo, DO 05/17/2022, 6:49 AM PGY-3, [facility]

## 2022-05-17 NOTE — Addendum Note (Signed)
Addendum  created 05/17/22 1232 by Adair Laundry, CRNA   Intraprocedure Meds edited

## 2022-05-24 ENCOUNTER — Telehealth (HOSPITAL_COMMUNITY): Payer: Self-pay | Admitting: *Deleted

## 2022-05-24 ENCOUNTER — Ambulatory Visit: Payer: No Typology Code available for payment source

## 2022-05-24 NOTE — Telephone Encounter (Signed)
Left phone voicemail message.  Duffy Rhody, RN 05-24-2022 at 1:47pm

## 2022-05-26 ENCOUNTER — Ambulatory Visit: Payer: Medicaid Other

## 2022-05-26 DIAGNOSIS — Z5189 Encounter for other specified aftercare: Secondary | ICD-10-CM

## 2022-05-26 NOTE — Progress Notes (Signed)
..   Subjective:     Maria Meyer is a 31 y.o. female who presents to the clinic 1 week status post  c-section . Eating a regular diet without difficulty. Bowel movements are  becoming more regular but with hemorrhoids . Pt states that she will use OTC methods for relief. Pain is controlled with current analgesics. Medications being used: ibuprofen (OTC).   Review of Systems Pertinent items are noted in HPI.    Objective:    LMP 08/15/2021  General:  alert and cooperative  Abdomen: soft, bowel sounds active, non-tender  Incision:   healing well, no drainage, no erythema, no hernia, no seroma, no swelling, no dehiscence, incision well approximated     Assessment:    Doing well postoperatively.   Plan:    1. Continue any current medications. 2. Wound care discussed. 3. Activity restrictions: no climbing  , no lifting more than 20 pounds, no repetitive motion, no sports, and no stairs 4. Anticipated return to work:  8-12 weeks . 5. Follow up: 5 weeks for postpartum.   Katrina Stack, RN

## 2022-06-29 ENCOUNTER — Ambulatory Visit: Payer: No Typology Code available for payment source | Admitting: Advanced Practice Midwife

## 2022-09-17 ENCOUNTER — Ambulatory Visit: Payer: Medicaid Other | Admitting: Student

## 2022-09-20 ENCOUNTER — Telehealth: Payer: Self-pay | Admitting: *Deleted

## 2022-09-20 ENCOUNTER — Other Ambulatory Visit: Payer: Self-pay | Admitting: *Deleted

## 2022-09-20 DIAGNOSIS — Z30011 Encounter for initial prescription of contraceptive pills: Secondary | ICD-10-CM

## 2022-09-20 MED ORDER — NORETHINDRONE 0.35 MG PO TABS: 1.0000 | ORAL_TABLET | Freq: Every day | ORAL | 0 refills | Status: DC

## 2022-09-20 NOTE — Progress Notes (Signed)
TC from pt reporting that IUD fell out. Requesting contraceptive pill as a gap measure. Dr. Jolayne Panther was consulted. Verbal orders received for 1 pack lyza and pt to be advised to come for an appointment for further RX.

## 2022-09-20 NOTE — Telephone Encounter (Signed)
TC to notify pt of RX Lyza sent and need for in person appt for further RX.

## 2022-09-28 ENCOUNTER — Ambulatory Visit: Payer: Medicaid Other | Admitting: Obstetrics

## 2022-10-12 ENCOUNTER — Other Ambulatory Visit: Payer: Self-pay | Admitting: Obstetrics and Gynecology

## 2022-10-12 DIAGNOSIS — Z30011 Encounter for initial prescription of contraceptive pills: Secondary | ICD-10-CM

## 2022-10-25 ENCOUNTER — Encounter: Payer: Self-pay | Admitting: Obstetrics and Gynecology

## 2022-10-25 ENCOUNTER — Telehealth (INDEPENDENT_AMBULATORY_CARE_PROVIDER_SITE_OTHER): Payer: Medicaid Other | Admitting: Obstetrics and Gynecology

## 2022-10-25 ENCOUNTER — Other Ambulatory Visit: Payer: Self-pay

## 2022-10-25 DIAGNOSIS — Z30011 Encounter for initial prescription of contraceptive pills: Secondary | ICD-10-CM

## 2022-10-25 MED ORDER — NORETHINDRONE 0.35 MG PO TABS: 1.0000 | ORAL_TABLET | Freq: Every day | ORAL | 12 refills | Status: AC

## 2022-10-25 NOTE — Progress Notes (Signed)
    GYNECOLOGY VIRTUAL VISIT ENCOUNTER NOTE  Provider location: [facility] [place] at [facility]   Patient location: Home  I connected with Maria Meyer on 10/25/22 at  1:30 PM EST by MyChart Video Encounter and verified that I am speaking with the correct person using two identifiers.   I discussed the limitations, risks, security and privacy concerns of performing an evaluation and management service virtually and the availability of in person appointments. I also discussed with the patient that there may be a patient responsible charge related to this service. The patient expressed understanding and agreed to proceed.   History:  Maria Meyer is a 32 y.o. [phone] female being evaluated today for contraception counseling. Patient had a Mirena IUD placed at the time of her c-section in 04/2022. IUD was accidentally pulled. Patient has been using POP for contraception for the past month and wishes to continue. She denies any abnormal vaginal discharge, bleeding, pelvic pain or other concerns.       Past Medical History:  Diagnosis Date   Anxiety    Headache    tension   Past Surgical History:  Procedure Laterality Date   CESAREAN SECTION  01/04/2013   CESAREAN SECTION N/A 05/15/2022   Procedure: CESAREAN SECTION;  Surgeon: Chancy Milroy, MD;  Location: [facility];  Service: Obstetrics;  Laterality: N/A;   DILATION AND EVACUATION N/A 07/15/2021   Procedure: DILATATION AND EVACUATION;  Surgeon: Osborne Oman, MD;  Location: [place];  Service: Gynecology;  Laterality: N/A;   INTRAUTERINE DEVICE (IUD) INSERTION  05/15/2022   Procedure: INTRAUTERINE DEVICE (IUD) INSERTION;  Surgeon: Chancy Milroy, MD;  Location: [facility];  Service: Obstetrics;;   The following portions of the patient's history were reviewed and updated as appropriate: allergies, current medications, past family history, past medical history, past social history, past surgical history and  problem list.   Health Maintenance:  Normal pap and negative HRHPV on 11/2021.   Review of Systems:  Pertinent items noted in HPI and remainder of comprehensive ROS otherwise negative.  Physical Exam:   General:  Alert, oriented and cooperative. Patient appears to be in no acute distress.  Mental Status: Normal mood and affect. Normal behavior. Normal judgment and thought content.   Respiratory: Normal respiratory effort, no problems with respiration noted  Rest of physical exam deferred due to type of encounter  Labs and Imaging No results found for this or any previous visit (from the past 336 hour(s)). No results found.     Assessment and Plan:     1. Encounter for initial prescription of contraceptive pills Refill on POP provided Patient is considering having Mirena IUD inserted at the time of her pap smear RTC next month or later for annual exam with possible IUD insertion - norethindrone (MICRONOR) 0.35 MG tablet; Take 1 tablet (0.35 mg total) by mouth daily.  Dispense: 28 tablet; Refill: 12       I discussed the assessment and treatment plan with the patient. The patient was provided an opportunity to ask questions and all were answered. The patient agreed with the plan and demonstrated an understanding of the instructions.   The patient was advised to call back or seek an in-person evaluation/go to the ED if the symptoms worsen or if the condition fails to improve as anticipated.  I provided 15 minutes of face-to-face time during this encounter.   Mora Bellman, MD [facility] [place]

## 2022-11-02 ENCOUNTER — Ambulatory Visit: Payer: Medicaid Other | Admitting: Advanced Practice Midwife

## 2022-12-20 ENCOUNTER — Ambulatory Visit: Payer: Medicaid Other | Admitting: Obstetrics and Gynecology

## 2023-05-20 IMAGING — US US OB LIMITED
1 series · 6 of 6 positions shown · non-contrast
Comparison: none

[Series 1: us ob limited · 6 of 6 slices shown]
[im 1/6]
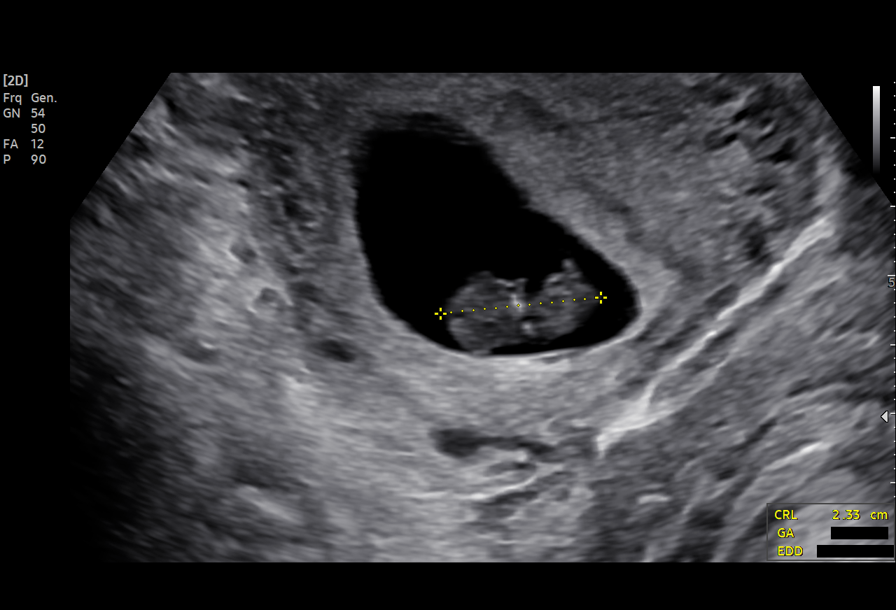
[im 2/6]
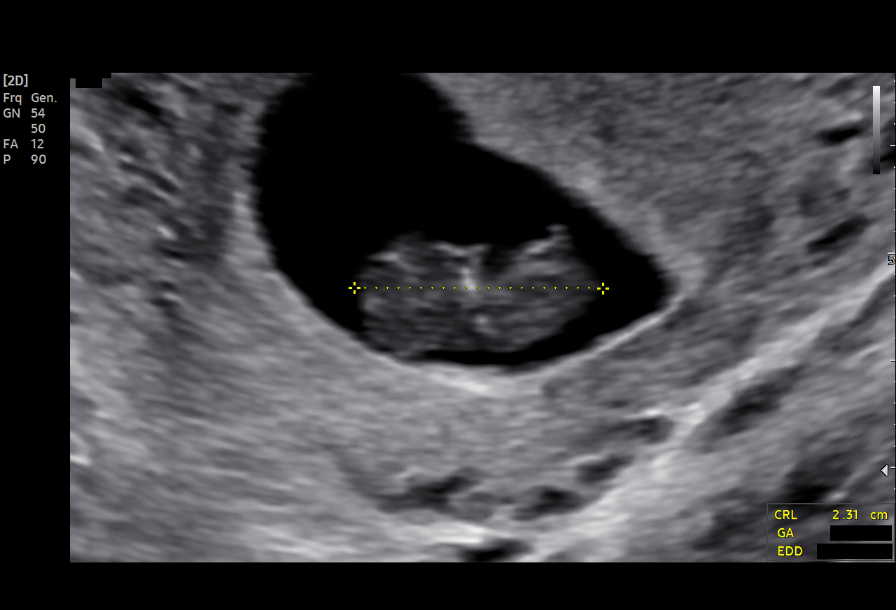
[im 3/6]
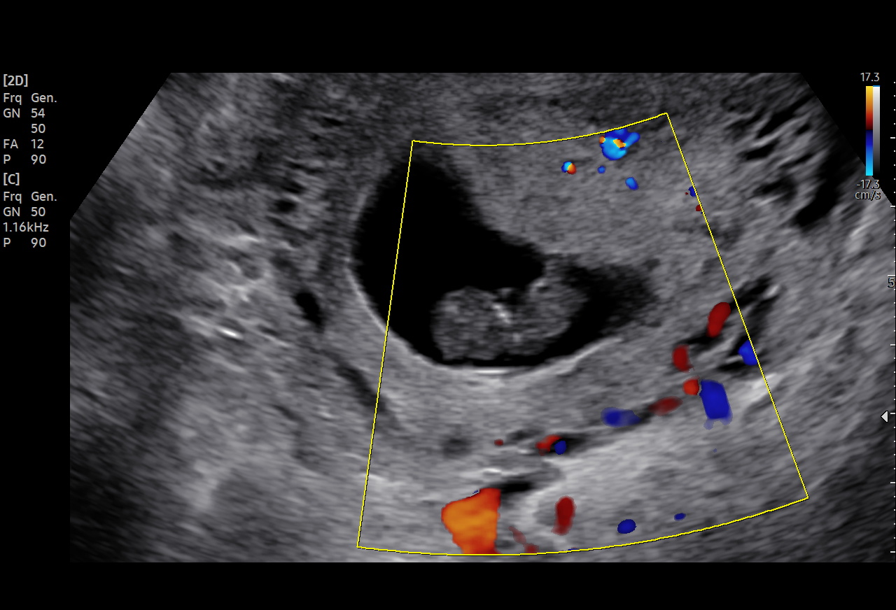
[im 4/6]
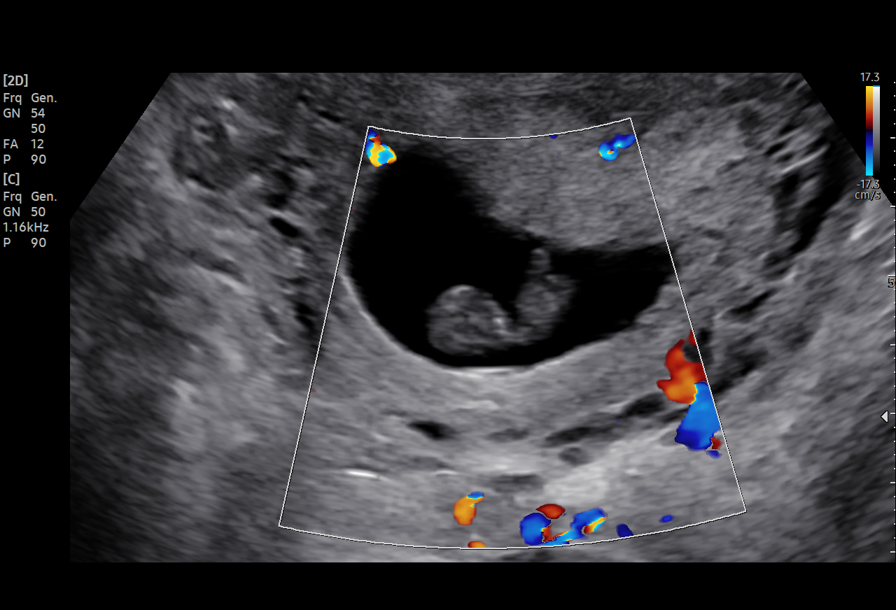
[im 5/6]
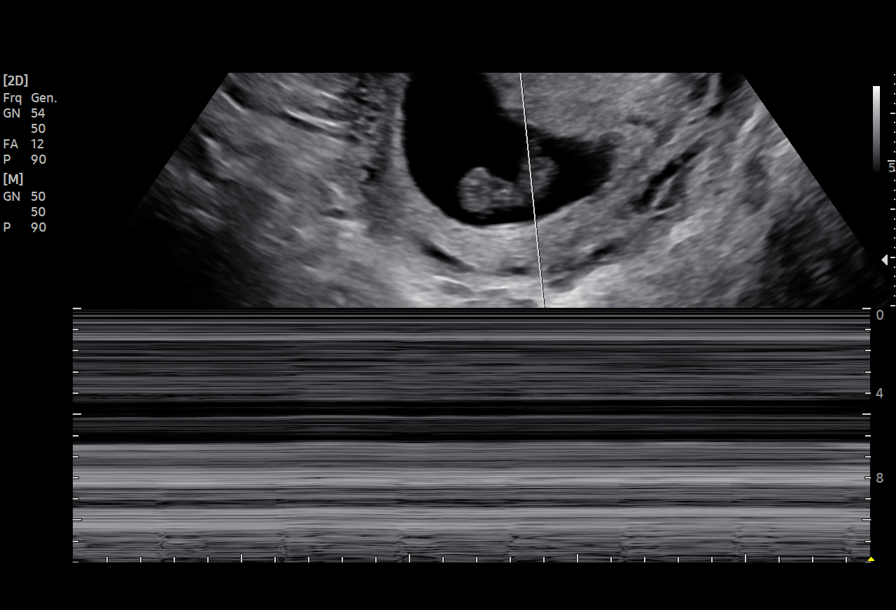
[im 6/6]
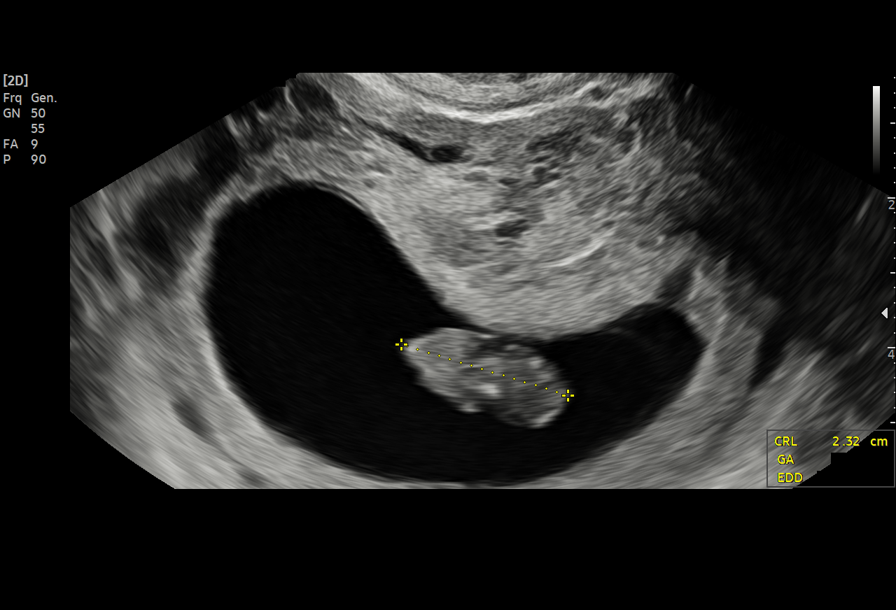

[6 of 6 positions shown; findings below may reference images not displayed]

[REDACTED]care

 1  [HOSPITAL]                         [id number]     SIPICZKI KOBZA

Indications

 8 weeks gestation of pregnancy
Fetal Evaluation

 Num Of Fetuses:         1
 Preg. Location:         Intrauterine
 Cardiac Activity:       Not visualized
Biometry

 CRL:      23.2  mm     G. Age:  8w 6d                   EDD:   02/16/22
Gestational Age

 Best:          8w 6d      Det. By:  U/S C R L (07/13/21)     EDD:   02/16/22
Comments

 Non living IUP at 8w6d by CRL.
Impression

 Missed abortion around nine weeks gestation
Recommendations

 Patient was counseled by Dr. Jaziel about options for
 management, she will be scheduled for surgical management.
              Beltrame, Abdel Fettah
